# Patient Record
Sex: Male | Born: 1935
Health system: Southern US, Community
[De-identification: ages and names within clinical notes are randomized; demographics above are authoritative.]

## PROBLEM LIST (undated history)

## (undated) DIAGNOSIS — D126 Benign neoplasm of colon, unspecified: Secondary | ICD-10-CM

## (undated) DIAGNOSIS — E079 Disorder of thyroid, unspecified: Secondary | ICD-10-CM

## (undated) DIAGNOSIS — I639 Cerebral infarction, unspecified: Secondary | ICD-10-CM

## (undated) DIAGNOSIS — C61 Malignant neoplasm of prostate: Secondary | ICD-10-CM

## (undated) HISTORY — PX: PROSTATE BIOPSY: SHX241

## (undated) HISTORY — PX: INGUINAL HERNIA REPAIR: SUR1180

## (undated) HISTORY — DX: Malignant neoplasm of prostate: C61

## (undated) HISTORY — PX: COLONOSCOPY: SHX174

## (undated) HISTORY — DX: Cerebral infarction, unspecified: I63.9

---

## 2015-12-13 ENCOUNTER — Ambulatory Visit
Admission: RE | Admit: 2015-12-13 | Discharge: 2015-12-13 | Disposition: A | Discharge status: Home / Self Care | Payer: Medicare Other | Source: Ambulatory Visit | Attending: Radiation Oncology | Admitting: Radiation Oncology

## 2015-12-13 ENCOUNTER — Telehealth: Payer: Self-pay | Admitting: Radiation Oncology

## 2015-12-13 ENCOUNTER — Encounter: Payer: Self-pay | Admitting: Radiation Oncology

## 2015-12-13 VITALS — BP 122/86 | HR 100 | Temp 98.4°F | Ht 66.0 in | Wt 148.8 lb

## 2015-12-13 DIAGNOSIS — C61 Malignant neoplasm of prostate: Secondary | ICD-10-CM | POA: Diagnosis not present

## 2015-12-13 DIAGNOSIS — Z87891 Personal history of nicotine dependence: Secondary | ICD-10-CM | POA: Diagnosis not present

## 2015-12-13 DIAGNOSIS — Z79899 Other long term (current) drug therapy: Secondary | ICD-10-CM | POA: Insufficient documentation

## 2015-12-13 HISTORY — DX: Disorder of thyroid, unspecified: E07.9

## 2015-12-13 HISTORY — DX: Benign neoplasm of colon, unspecified: D12.6

## 2015-12-13 NOTE — Progress Notes (Signed)
GU Location of Tumor / Histology: prostatic adenocarcinoma  If Prostate Cancer, Gleason Score is (3 + 4) and PSA is (15.9)  Brendan Lin with a history of prostate ca diagnosed 18 months ago.  Biopsies of prostate (if applicable) revealed:    Past/Anticipated interventions by urology, if any: Biopsy and referral to radiation oncology  Past/Anticipated interventions by medical oncology, if any: no  Weight changes, if any: denies weight loss or chronic fevers.  Bowel/Bladder complaints, if any:  Denies any dysuria, full stream without straining  Nausea/Vomiting, if any: no  Pain issues, if any:  Denies any bone or joint pain.  SAFETY ISSUES:  Prior radiation? No  Pacemaker/ICD? No  Possible current pregnancy? no  Is the patient on methotrexate? no  Current Complaints / other details:  82 year old male. Moving from [place] to [place]. AX: sulfa

## 2015-12-13 NOTE — Telephone Encounter (Signed)
I spoke with the patient to see if he would like to come sooner for evaluation by Dr. Tammi Klippel. He will come today at 3pm for nurse eval and 3:30 eval with MD.

## 2015-12-13 NOTE — Progress Notes (Deleted)
Radiation Oncology         (336) [phone] ________________________________   Initial Radiation Oncology Consultation  Name: Brendan Lin MRN: [id number]  Date: 12/13/2015  DOB: March 15, 1932  CC:No primary care provider on file.  [name], MD   REFERRING PHYSICIAN: [name], MD  DIAGNOSIS: 82 y.o. gentleman with unknown T stage adenocarcinoma of the prostate with a Gleason's score of 3+4 and a PSA of 19.8    ICD-9-CM ICD-10-CM   1. Malignant neoplasm of prostate ([place]) [place] Ciminelli is a 82 y.o. gentleman seen for prostate cancer.  He was noted to have an elevated PSA of 19.800 by his primary care physician, Dr. Marjo Bicker. He has a longstanding history of probable low risk adenocarcinoma of the prostate and has been followed with PSA levels and intermittent biopsies but has not required treatment up until this time. His original biopsies are unavailable for review. He was receiving his care in [place] in January of 2016 his PSA was 15.86. He was offered repeat biopsy again after this continued to be elevated and underwent this on February 8th, 2017 in [place]. Of the 6 biopsy cores that were obtained, 3 revealed malignancy with a maximum Gleason score of 3+4 in two cores and he also had a 3+3 in one core. He relocated to [facility] and his PSA was redrawn on June 14th, 2017 and was 19.8. He has been offered radiotherapy in the spring, and is interested now in proceeding with treatment while his cancer is still intermediate risk. He comes today to meet with Dr. Tammi Klippel to discuss radiotherapy.   PREVIOUS RADIATION THERAPY: No  PAST MEDICAL HISTORY:  Past Medical History  Diagnosis Date  . Prostate cancer ([place])   . Tubular adenoma of colon     found during colonoscopy 2016  . Thyroid disease      PAST SURGICAL HISTORY: Past Surgical History  Procedure Laterality Date  . Prostate biopsy    . Colonoscopy      FAMILY HISTORY: Patient reports  that his father passed of metastatic  prostate cancer and his brother also suffered from prostate cancer. Patient denies family history of breast cancer.   SOCIAL HISTORY:  reports that he has quit smoking. His smoking use included Pipe. He does not have any smokeless tobacco history on file.Patient recently moved to [place]. Patient currently lives on his own, but has family in the area. Patient was a full pack smoker for 8 years and quit when he was 82 years old. Patient denies drug or alcohol use. Patient reports that he regularly exercises.   ALLERGIES: Sulfa antibiotics  MEDICATIONS:  Current Outpatient Prescriptions  Medication Sig Dispense Refill  . levothyroxine (SYNTHROID, LEVOTHROID) 75 MCG tablet Take 75 mcg by mouth daily before breakfast.     No current facility-administered medications for this encounter.    REVIEW OF SYSTEMS:   On review of systems, the patient reports that he is doing well overall. he denies any chest pain, shortness of breath, cough, fevers, chills, night sweats, unintended weight changes. he denies any bowel or bladder disturbances, and denies abdominal pain, nausea or vomiting. he denies any new musculoskeletal or joint aches or pains.  The patient completed an IPSS and IIEF questionnaire.  His IPSS score was 9 indicating moderate urinary outflow obstructive symptoms.  He indicated that his erectile function is poor, and he is unable to complete sexual activity. A complete review of systems is obtained and is  otherwise negative.    PHYSICAL EXAM:   height is 5\' 6"  (1.676 m) and weight is 148 lb 12.8 oz (67.495 kg). His temperature is 98.4 F (36.9 C). His blood pressure is 122/86 and his pulse is 100.   In general this is a well appearing African American male in no acute distress. He is alert and oriented x4 and appropriate throughout the examination. HEENT reveals that the patient is normocephalic, atraumatic. EOMs are intact. PERRLA. Skin is intact without  any evidence of gross lesions. Cardiovascular exam reveals a regular rate and rhythm, no clicks rubs or murmurs are auscultated. Chest is clear to auscultation bilaterally. Lymphatic assessment is performed and does not reveal any adenopathy in the cervical, supraclavicular, axillary, or inguinal chains. Abdomen has active bowel sounds in all quadrants and is intact. The abdomen is soft, non tender, non distended. Lower extremities are negative for pretibial pitting edema, deep calf tenderness, cyanosis or clubbing.   KPS = 100  100 - Normal; no complaints; no evidence of disease. 90   - Able to carry on normal activity; minor signs or symptoms of disease. 80   - Normal activity with effort; some signs or symptoms of disease. 59   - Cares for self; unable to carry on normal activity or to do active work. 60   - Requires occasional assistance, but is able to care for most of his personal needs. 50   - Requires considerable assistance and frequent medical care. 54   - Disabled; requires special care and assistance. 15   - Severely disabled; hospital admission is indicated although death not imminent. 74   - Very sick; hospital admission necessary; active supportive treatment necessary. 10   - Moribund; fatal processes progressing rapidly. 0     - Dead  Karnofsky DA, Abelmann WH, Craver LS and Burchenal JH [phone]) The use of the nitrogen mustards in the palliative treatment of carcinoma: with particular reference to bronchogenic carcinoma Cancer 1 634-56   LABORATORY DATA:  No results found for: WBC, HGB, HCT, MCV, PLT No results found for: NA, K, CL, CO2 No results found for: ALT, AST, GGT, ALKPHOS, BILITOT   RADIOGRAPHY: No results found.    IMPRESSION: This gentleman is 82 y.o.gentleman with prostate cancer.  His Gleason's Score, and PSA put him into the intermediate risk group.  Accordingly he is eligible for radiotherapy to the prostate with consideration of androgen depravation.    PLAN: Dr. Tammi Klippel discusses the findings on his biopsy from February of last year as well as his most recent PSA levels. We discussed the rationale for moving forward with radiotherapy to treat his prostate cancer, and in addition would recommend 6 months of androgen deprivation therapy. The patient is interested in moving forward, and we will coordinate his androgen deprivation as well as fiducial gold markers of the prostate to be placed. We will plan to begin simulation for radiotherapy 2 months after his initial androgen deprivation injection. I will contact the [facility] to determine how forward with coordinating this.     Carola Rhine, PAC  This document serves as a record of services personally performed by [name], PAC and [name], MD. It was created on theirbehalf by Truddie Hidden, a trained medical scribe. The creation of this record is based on the scribe's personal observations and the provider's statements to them. This document has been checked and approved by the attending provider.

## 2015-12-13 NOTE — Progress Notes (Signed)
See progress note under physician encounter. 

## 2015-12-14 ENCOUNTER — Ambulatory Visit: Admission: RE | Admit: 2015-12-14 | Payer: Medicare Other | Source: Ambulatory Visit | Admitting: Radiation Oncology

## 2015-12-14 ENCOUNTER — Ambulatory Visit: Payer: Medicare Other | Admitting: Radiation Oncology

## 2015-12-14 ENCOUNTER — Ambulatory Visit: Payer: Medicare Other

## 2015-12-22 ENCOUNTER — Telehealth: Payer: Self-pay | Admitting: Radiation Oncology

## 2015-12-22 NOTE — Telephone Encounter (Signed)
Phoned scheduler, Lattie Haw, at [facility]. She confirms the patient does not have an appointment with a provider there nor a referral to arrange an appointment. Phoned [phone] to inquire about status of referral from [facility] @ Dr. Marjo Bicker' office. No answer. Left message requesting return call.

## 2015-12-23 ENCOUNTER — Telehealth: Payer: Self-pay | Admitting: Radiation Oncology

## 2015-12-23 NOTE — Telephone Encounter (Signed)
Amber returned my call and directed me to call Beverely Low, PA with non VA affairs. Amber reports Dr. Marjo Bicker has placed a consult referral but, Izora Gala needs to provide authorization. Hung up with [employer] and called Izora Gala at [phone] ext [phone]. Left detailed message for Izora Gala and requested return call.

## 2015-12-23 NOTE — Telephone Encounter (Signed)
Received call from Beverely Low, [place] for Non VA services that the patient was approved for fiducial marker placement. She explained the patient will be contacted next and questioned about preference of place and time frame. Next, I phoned the patient as directed by [name], PA-C making him aware the VA has approved him and will be contacting him via phone. Stressed that [facility] would be ideal and as soon as possible is preferred. Patient reports the VA left him a message already and he plan to call them when we hang up.

## 2016-01-18 ENCOUNTER — Encounter: Payer: Self-pay | Admitting: Radiation Oncology

## 2016-01-18 NOTE — Progress Notes (Signed)
Per Sharyn Lull at [facility]. Patient is scheduled for an initial visit with Dr. Alinda Money on August 15 th at 1100.

## 2016-02-02 ENCOUNTER — Telehealth: Payer: Self-pay | Admitting: *Deleted

## 2016-02-02 NOTE — Telephone Encounter (Signed)
CALLED PATIENT TO INFORM OF APPT. FOR ANDROGEN DEPR. AND HIS GOLD SEED PLACEMENT ON 03-16-16 @ 9:30 AM @ DR. BORDEN'S OFFICE AND HIS SIM ON 05-18-16 @ 10 AM @ DR. MANNING'S OFFICE, NO ANSWER, MAILED APPT. CARD

## 2016-02-12 ENCOUNTER — Encounter: Payer: Self-pay | Admitting: Radiation Oncology

## 2016-02-12 NOTE — Progress Notes (Signed)
Patient seen at [facility] on 02/02/16 and received [place], the note indicates urology follow-up to place gold markers on 03/14/16  According to this correspondence, he should be ready to start radiotherapy around 04/03/16, so I tentatively set up 03/23/16 simulation.

## 2016-02-14 ENCOUNTER — Telehealth: Payer: Self-pay | Admitting: *Deleted

## 2016-02-14 ENCOUNTER — Telehealth: Payer: Self-pay | Admitting: Radiation Oncology

## 2016-02-14 NOTE — Telephone Encounter (Signed)
CALLED PATIENT TO INFORM OF LUPRON INJECTION AND GOLD SEED PLACEMENT ON 03-16-16 - ARRIVAL TIME - 9:30 AM @ DR. BORDEN'S OFFICE, PATIENT STATED THAT HE STARTED [facility] ON 02-02-16 , REMINDED PATIENT OF SIM ON 03-23-16 @ 11 AM @ DR. MANNING'S OFFICE

## 2016-02-14 NOTE — Telephone Encounter (Signed)
Received inbasket message from Dr. Tammi Klippel that this patient is scheduled for gold marker placement with Dr. Alinda Money on 9/27. Phoned patient and informed him of simulation appointment with Dr. Tammi Klippel at [facility] for 1100 on 03/23/16. Patient confirmed appointment. Patient understands to contact this RN should something change with his gold marker placement. Patient understands simulation can't take place until gold markers are placed.

## 2016-02-15 ENCOUNTER — Other Ambulatory Visit (HOSPITAL_COMMUNITY): Payer: Self-pay | Admitting: Internal Medicine

## 2016-02-15 DIAGNOSIS — D075 Carcinoma in situ of prostate: Secondary | ICD-10-CM

## 2016-02-27 ENCOUNTER — Encounter (HOSPITAL_COMMUNITY)
Admission: RE | Admit: 2016-02-27 | Discharge: 2016-02-27 | Disposition: A | Discharge status: Home / Self Care | Payer: Non-veteran care | Source: Ambulatory Visit | Attending: Internal Medicine | Admitting: Internal Medicine

## 2016-02-27 DIAGNOSIS — D075 Carcinoma in situ of prostate: Secondary | ICD-10-CM | POA: Insufficient documentation

## 2016-02-27 MED ORDER — FLUDEOXYGLUCOSE F - 18 (FDG) INJECTION
9.3000 | Freq: Once | INTRAVENOUS | Status: AC | PRN
  Administered 2016-02-27: 9.3 via INTRAVENOUS

## 2016-03-23 ENCOUNTER — Ambulatory Visit
Admission: RE | Admit: 2016-03-23 | Discharge: 2016-03-23 | Disposition: A | Discharge status: Home / Self Care | Payer: Medicare Other | Source: Ambulatory Visit | Attending: Radiation Oncology | Admitting: Radiation Oncology

## 2016-03-23 DIAGNOSIS — C61 Malignant neoplasm of prostate: Secondary | ICD-10-CM

## 2016-03-23 NOTE — Progress Notes (Signed)
  Radiation Oncology         (336) [phone] ________________________________  Name: Brendan Lin MRN: [id number]  Date: 03/23/2016  DOB: 28-Jun-1931  SIMULATION AND TREATMENT PLANNING NOTE    ICD-9-CM ICD-10-CM   1. Malignant neoplasm of prostate ([place]) 185 C61     DIAGNOSIS:  82 y.o. gentleman with unknown T1c stage adenocarcinoma of the prostate with a Gleason's score of 3+4 and a PSA of 19.8  NARRATIVE:  Rescheduled to 10/13 ________________________________  Sheral Apley. Tammi Klippel, M.D.

## 2016-03-30 ENCOUNTER — Ambulatory Visit
Admission: RE | Admit: 2016-03-30 | Discharge: 2016-03-30 | Disposition: A | Discharge status: Home / Self Care | Payer: Medicare Other | Source: Ambulatory Visit | Attending: Radiation Oncology | Admitting: Radiation Oncology

## 2016-03-30 DIAGNOSIS — C61 Malignant neoplasm of prostate: Secondary | ICD-10-CM | POA: Diagnosis not present

## 2016-03-30 DIAGNOSIS — Z510 Encounter for antineoplastic radiation therapy: Secondary | ICD-10-CM | POA: Insufficient documentation

## 2016-03-30 NOTE — Progress Notes (Signed)
  Radiation Oncology         (336) [phone] ________________________________  Name: Alice Badenhop MRN: [id number]  Date: 03/30/2016  DOB: 19-Apr-1932  SIMULATION AND TREATMENT PLANNING NOTE    ICD-9-CM ICD-10-CM   1. Malignant neoplasm of prostate ([place]) 185 C61     DIAGNOSIS:  82 y.o. gentleman with stage T1c N0 M0 adenocarcinoma of the prostate with a Gleason's score of 3+4 and a PSA of 19.8  NARRATIVE:  The patient was brought to the [facility].  Identity was confirmed.  All relevant records and images related to the planned course of therapy were reviewed.  The patient freely provided informed written consent to proceed with treatment after reviewing the details related to the planned course of therapy. The consent form was witnessed and verified by the simulation staff.  Then, the patient was set-up in a stable reproducible supine position for radiation therapy.  A vacuum lock pillow device was custom fabricated to position his legs in a reproducible immobilized position.  Then, I performed a urethrogram under sterile conditions to identify the prostatic apex.  CT images were obtained.  Surface markings were placed.  The CT images were loaded into the planning software.  Then the prostate target and avoidance structures including the rectum, bladder, bowel and hips were contoured.  Treatment planning then occurred.  The radiation prescription was entered and confirmed.  A total of one complex treatment devices were fabricated. I have requested : Intensity Modulated Radiotherapy (IMRT) is medically necessary for this case for the following reason:  Rectal sparing.Marland Kitchen  PLAN:  The patient will receive 78 Gy in 40 fractions.  ________________________________  Sheral Apley Tammi Klippel, M.D.

## 2016-04-04 DIAGNOSIS — Z510 Encounter for antineoplastic radiation therapy: Secondary | ICD-10-CM | POA: Diagnosis not present

## 2016-04-04 NOTE — Progress Notes (Signed)
Addendum: Attestation signed by [name], MD at 12/17/2015 8:41 AM  Please see the note from Brendan Simpson, PA-C from today's visit for more details of today's encounter. I have personally performed a face to face diagnostic evaluation on this patient and devised the assessment and plan.  Intermediate risk prostate cancer eligible for ADT and IMRT.  Will refer to [facility] for assessment.  If they concur, we can certainly coordinate and offer IMRT here in [place]  ------------------------------------------------   [name], MD [address] Director and Director of Stereotactic Radiosurgery Direct Dial: [phone]  Fax: [phone] [email or web address]  TEFL teacher All Collapse All    Radiation Oncology         (336) [phone] ________________________________   Initial Radiation Oncology Consultation  Name: Brendan Lin            MRN: [id number]         Date: 12/13/2015                      DOB: 1932-03-21  CC:No primary care provider on file.  Brendan Milan, MD   REFERRING PHYSICIAN: [name], MD  DIAGNOSIS: 82 y.o. gentleman with Stage T1c adenocarcinoma of the prostate with a Gleason's score of 3+4 and a PSA of 19.8    ICD-9-CM ICD-10-CM   1. Malignant neoplasm of prostate ([place]) Brendan Lin is a 82 y.o. gentleman seen for prostate cancer.  He was noted to have an elevated PSA of 19.800 by his primary care physician, Dr. Marjo Bicker. He has a longstanding history of probable low risk adenocarcinoma of the prostate and has been followed with PSA levels and intermittent biopsies but has not required treatment up until this time. His original biopsies are unavailable for review. He was receiving his care in [place] in January of 2016 his PSA was 15.86. He was offered repeat biopsy again after this continued to be elevated and underwent this on February 8th,  2017 in [place]. Of the 6 biopsy cores that were obtained, 3 revealed malignancy with a maximum Gleason score of 3+4 in two cores and he also had a 3+3 in one core. He relocated to [facility] and his PSA was redrawn on June 14th, 2017 and was 19.8. He has been offered radiotherapy in the spring, and is interested now in proceeding with treatment while his cancer is still intermediate risk. He comes today to meet with Dr. Tammi Klippel to discuss radiotherapy.   PREVIOUS RADIATION THERAPY: No  PAST MEDICAL HISTORY:       Past Medical History  Diagnosis Date  . Prostate cancer ([place])   . Tubular adenoma of colon     found during colonoscopy 2016  . Thyroid disease      PAST SURGICAL HISTORY:      Past Surgical History  Procedure Laterality Date  . Prostate biopsy    . Colonoscopy      FAMILY HISTORY: Patient reports that his father passed of metastatic  prostate cancer and his brother also suffered from prostate cancer. Patient denies family history of breast cancer.   SOCIAL HISTORY:  reports that he has quit smoking. His smoking use included Pipe. He does not have any smokeless tobacco history on file.Patient recently moved to [place]. Patient currently lives on his own, but has family in the area. Patient was a full pack smoker for 8 years and quit  when he was 82 years old. Patient denies drug or alcohol use. Patient reports that he regularly exercises.   ALLERGIES: Sulfa antibiotics  MEDICATIONS:        Current Outpatient Prescriptions  Medication Sig Dispense Refill  . levothyroxine (SYNTHROID, LEVOTHROID) 75 MCG tablet Take 75 mcg by mouth daily before breakfast.     No current facility-administered medications for this encounter.    REVIEW OF SYSTEMS:   On review of systems, the patient reports that he is doing well overall. he denies any chest pain, shortness of breath, cough, fevers, chills, night sweats, unintended weight changes. he denies any bowel or  bladder disturbances, and denies abdominal pain, nausea or vomiting. he denies any new musculoskeletal or joint aches or pains.  The patient completed an IPSS and IIEF questionnaire.  His IPSS score was 9 indicating moderate urinary outflow obstructive symptoms.  He indicated that his erectile function is poor, and he is unable to complete sexual activity. A complete review of systems is obtained and is otherwise negative.    PHYSICAL EXAM:   height is 5\' 6"  (1.676 m) and weight is 148 lb 12.8 oz (67.495 kg). His temperature is 98.4 F (36.9 C). His blood pressure is 122/86 and his pulse is 100.   In general this is a well appearing African American male in no acute distress. He is alert and oriented x4 and appropriate throughout the examination. HEENT reveals that the patient is normocephalic, atraumatic. EOMs are intact. PERRLA. Skin is intact without any evidence of gross lesions. Cardiovascular exam reveals a regular rate and rhythm, no clicks rubs or murmurs are auscultated. Chest is clear to auscultation bilaterally. Lymphatic assessment is performed and does not reveal any adenopathy in the cervical, supraclavicular, axillary, or inguinal chains. Abdomen has active bowel sounds in all quadrants and is intact. The abdomen is soft, non tender, non distended. Lower extremities are negative for pretibial pitting edema, deep calf tenderness, cyanosis or clubbing.   KPS = 100  100 - Normal; no complaints; no evidence of disease. 90   - Able to carry on normal activity; minor signs or symptoms of disease. 80   - Normal activity with effort; some signs or symptoms of disease. 50   - Cares for self; unable to carry on normal activity or to do active work. 60   - Requires occasional assistance, but is able to care for most of his personal needs. 50   - Requires considerable assistance and frequent medical care. 60   - Disabled; requires special care and assistance. 86   - Severely disabled;  hospital admission is indicated although death not imminent. 21   - Very sick; hospital admission necessary; active supportive treatment necessary. 10   - Moribund; fatal processes progressing rapidly. 0     - Dead  Karnofsky DA, Abelmann WH, Craver LS and Burchenal JH [phone]) The use of the nitrogen mustards in the palliative treatment of carcinoma: with particular reference to bronchogenic carcinoma Cancer 1 634-56   LABORATORY DATA:  Recent Labs  No results found for: WBC, HGB, HCT, MCV, PLT   Recent Labs  No results found for: NA, K, CL, CO2   Recent Labs  No results found for: ALT, AST, GGT, ALKPHOS, BILITOT     RADIOGRAPHY:  Imaging Results  No results found.      IMPRESSION: This gentleman is 82 y.o.gentleman with prostate cancer.  His Gleason's Score, and PSA put him into the intermediate risk group.  Accordingly  he is eligible for radiotherapy to the prostate with consideration of androgen depravation.   PLAN: Dr. Tammi Klippel discusses the findings on his biopsy from February of last year as well as his most recent PSA levels. We discussed the rationale for moving forward with radiotherapy to treat his prostate cancer, and in addition would recommend 6 months of androgen deprivation therapy. The patient is interested in moving forward, and we will coordinate his androgen deprivation as well as fiducial gold markers of the prostate to be placed. We will plan to begin simulation for radiotherapy 2 months after his initial androgen deprivation injection. I will contact the [facility] to determine how forward with coordinating this.     Carola Rhine, PAC  This document serves as a record of services personally performed by Brendan Lin, PAC and [name], MD. It was created on theirbehalf by Truddie Hidden, a trained medical scribe. The creation of this record is based on the scribe's personal observations and the provider's statements to them. This document has been  checked and approved by the attending provider.         Cosigned by: [name], MD[12/17/2015 8:41 AM]

## 2016-04-04 NOTE — Addendum Note (Signed)
Encounter addended by: Hayden Pedro, PA-C on: 04/04/2016  9:42 AM<BR>    Actions taken: Sign clinical note, Delete clinical note

## 2016-04-10 ENCOUNTER — Ambulatory Visit
Admission: RE | Admit: 2016-04-10 | Discharge: 2016-04-10 | Disposition: A | Discharge status: Home / Self Care | Payer: Medicare Other | Source: Ambulatory Visit | Attending: Radiation Oncology | Admitting: Radiation Oncology

## 2016-04-10 DIAGNOSIS — Z510 Encounter for antineoplastic radiation therapy: Secondary | ICD-10-CM | POA: Diagnosis not present

## 2016-04-11 ENCOUNTER — Ambulatory Visit
Admission: RE | Admit: 2016-04-11 | Discharge: 2016-04-11 | Disposition: A | Discharge status: Home / Self Care | Payer: Medicare Other | Source: Ambulatory Visit | Attending: Radiation Oncology | Admitting: Radiation Oncology

## 2016-04-11 DIAGNOSIS — Z510 Encounter for antineoplastic radiation therapy: Secondary | ICD-10-CM | POA: Diagnosis not present

## 2016-04-12 ENCOUNTER — Ambulatory Visit
Admission: RE | Admit: 2016-04-12 | Discharge: 2016-04-12 | Disposition: A | Discharge status: Home / Self Care | Payer: Medicare Other | Source: Ambulatory Visit | Attending: Radiation Oncology | Admitting: Radiation Oncology

## 2016-04-12 DIAGNOSIS — Z510 Encounter for antineoplastic radiation therapy: Secondary | ICD-10-CM | POA: Diagnosis not present

## 2016-04-13 ENCOUNTER — Ambulatory Visit
Admission: RE | Admit: 2016-04-13 | Discharge: 2016-04-13 | Disposition: A | Discharge status: Home / Self Care | Payer: Medicare Other | Source: Ambulatory Visit | Attending: Radiation Oncology | Admitting: Radiation Oncology

## 2016-04-13 ENCOUNTER — Encounter: Payer: Self-pay | Admitting: Radiation Oncology

## 2016-04-13 VITALS — BP 129/79 | HR 62 | Resp 16 | Wt 153.2 lb

## 2016-04-13 DIAGNOSIS — Z510 Encounter for antineoplastic radiation therapy: Secondary | ICD-10-CM | POA: Diagnosis not present

## 2016-04-13 DIAGNOSIS — C61 Malignant neoplasm of prostate: Secondary | ICD-10-CM

## 2016-04-13 NOTE — Progress Notes (Signed)
Weight and vitals stable. Denies pain. Reports nocturia every hour and a half. Denies urinary frequency during the day. Denies dysuria or hematuria. Denies urinary urgency, leakage or incontinence. Describes a slow and steady urine stream without difficulty emptying his bladder.   Denies blood in his blood. Denies any bowel complaints. Reports daily he does arm exercise, modified sit ups and walks a mile and a half each day.   BP 129/79 (BP Location: Left Arm, Patient Position: Sitting, Cuff Size: Normal)   Pulse 62   Resp 16   Wt 153 lb 3.2 oz (69.5 kg)   SpO2 100%   BMI 24.73 kg/m  Wt Readings from Last 3 Encounters:  04/13/16 153 lb 3.2 oz (69.5 kg)  12/13/15 148 lb 12.8 oz (67.5 kg)

## 2016-04-13 NOTE — Progress Notes (Signed)
  Radiation Oncology         [phone]   Name: Brendan Lin MRN: [id number]   Date: 04/13/2016  DOB: 09-05-1931   Weekly Radiation Therapy Management    ICD-9-CM ICD-10-CM   1. Malignant neoplasm of prostate ([facility]) 185 C61     Current Dose: 7.8 Gy  Planned Dose:  78 Gy  Narrative The patient presents for routine under treatment assessment.  Weight and vitals stable. Patient denies pain. He reports nocturia every hour and a half. He denies urinary frequency during the day. Patient denies dysuria or hematuria. He denies urinary urgency, leakage, or incontinence. The patient describes a slow and steady urine stream without difficulty emptying his bladder. He denies hematuria. The patient denies any bowel complaints. He reports that he does arm exercise daily, as well as modified sit ups and walks a mile and a half each day.    The patient is without complaint. Set-up films were reviewed. The chart was checked.  Physical Findings  weight is 153 lb 3.2 oz (69.5 kg). His blood pressure is 129/79 and his pulse is 62. His respiration is 16 and oxygen saturation is 100%. . Weight essentially stable.  No significant changes.  Impression The patient is tolerating radiation.  Plan Continue treatment as planned.         Sheral Apley Tammi Klippel, M.D. This document serves as a record of services personally performed by Brendan Pita, MD. It was created on his behalf by Maryla Morrow, a trained medical scribe. The creation of this record is based on the scribe's personal observations and the provider's statements to them. This document has been checked and approved by the attending provider.

## 2016-04-16 ENCOUNTER — Ambulatory Visit
Admission: RE | Admit: 2016-04-16 | Discharge: 2016-04-16 | Disposition: A | Discharge status: Home / Self Care | Payer: Medicare Other | Source: Ambulatory Visit | Attending: Radiation Oncology | Admitting: Radiation Oncology

## 2016-04-16 DIAGNOSIS — Z510 Encounter for antineoplastic radiation therapy: Secondary | ICD-10-CM | POA: Diagnosis not present

## 2016-04-17 ENCOUNTER — Inpatient Hospital Stay
Admission: RE | Admit: 2016-04-17 | Discharge: 2016-04-17 | Disposition: A | Discharge status: Home / Self Care | Payer: Self-pay | Source: Ambulatory Visit | Attending: Radiation Oncology | Admitting: Radiation Oncology

## 2016-04-17 ENCOUNTER — Ambulatory Visit
Admission: RE | Admit: 2016-04-17 | Discharge: 2016-04-17 | Disposition: A | Discharge status: Home / Self Care | Payer: Medicare Other | Source: Ambulatory Visit | Attending: Radiation Oncology | Admitting: Radiation Oncology

## 2016-04-17 DIAGNOSIS — Z510 Encounter for antineoplastic radiation therapy: Secondary | ICD-10-CM | POA: Diagnosis not present

## 2016-04-18 ENCOUNTER — Ambulatory Visit
Admission: RE | Admit: 2016-04-18 | Discharge: 2016-04-18 | Disposition: A | Discharge status: Home / Self Care | Payer: Medicare Other | Source: Ambulatory Visit | Attending: Radiation Oncology | Admitting: Radiation Oncology

## 2016-04-18 DIAGNOSIS — Z510 Encounter for antineoplastic radiation therapy: Secondary | ICD-10-CM | POA: Diagnosis not present

## 2016-04-19 ENCOUNTER — Inpatient Hospital Stay
Admission: RE | Admit: 2016-04-19 | Discharge: 2016-04-19 | Disposition: A | Discharge status: Home / Self Care | Payer: Self-pay | Source: Ambulatory Visit | Attending: Radiation Oncology | Admitting: Radiation Oncology

## 2016-04-19 ENCOUNTER — Ambulatory Visit
Admission: RE | Admit: 2016-04-19 | Discharge: 2016-04-19 | Disposition: A | Discharge status: Home / Self Care | Payer: Medicare Other | Source: Ambulatory Visit | Attending: Radiation Oncology | Admitting: Radiation Oncology

## 2016-04-19 DIAGNOSIS — Z510 Encounter for antineoplastic radiation therapy: Secondary | ICD-10-CM | POA: Diagnosis not present

## 2016-04-20 ENCOUNTER — Ambulatory Visit
Admission: RE | Admit: 2016-04-20 | Discharge: 2016-04-20 | Disposition: A | Discharge status: Home / Self Care | Payer: Medicare Other | Source: Ambulatory Visit | Attending: Radiation Oncology | Admitting: Radiation Oncology

## 2016-04-20 ENCOUNTER — Encounter: Payer: Self-pay | Admitting: Radiation Oncology

## 2016-04-20 VITALS — BP 117/68 | HR 74 | Temp 98.2°F | Resp 20 | Wt 152.2 lb

## 2016-04-20 DIAGNOSIS — Z510 Encounter for antineoplastic radiation therapy: Secondary | ICD-10-CM | POA: Diagnosis not present

## 2016-04-20 DIAGNOSIS — C61 Malignant neoplasm of prostate: Secondary | ICD-10-CM

## 2016-04-20 NOTE — Progress Notes (Signed)
   Department of Radiation Oncology  Phone:  [phone] Fax:        [phone]  Weekly Treatment Note    Name: Brendan Lin Date: 04/20/2016 MRN: [id number] DOB: Feb 13, 1932   Diagnosis:     ICD-9-CM ICD-10-CM   1. Malignant neoplasm of prostate (HCC) 185 C61      Current dose: 17.55 Gy  Current fraction:9   MEDICATIONS: Current Outpatient Prescriptions  Medication Sig Dispense Refill  . levothyroxine (SYNTHROID, LEVOTHROID) 75 MCG tablet Take 75 mcg by mouth daily before breakfast.    . timolol (BETIMOL) 0.25 % ophthalmic solution Place 1-2 drops into both eyes 2 (two) times daily. 3 gtts both etyes     No current facility-administered medications for this encounter.      ALLERGIES: Sulfa antibiotics   LABORATORY DATA:  No results found for: WBC, HGB, HCT, MCV, PLT No results found for: NA, K, CL, CO2 No results found for: ALT, AST, GGT, ALKPHOS, BILITOT   NARRATIVE: Brendan Lin was seen today for weekly treatment management. The chart was checked and the patient's films were reviewed.  Weekly rad txs prostate 9/40 completed. Denies hematuria or pain. Slow stream. Nocturia, gets up every 1.5 hours at night to void. Bowels good, exercises, appetite good, energy level good.  PHYSICAL EXAMINATION: weight is 152 lb 3.2 oz (69 kg). His oral temperature is 98.2 F (36.8 C). His blood pressure is 117/68 and his pulse is 74. His respiration is 20.    ASSESSMENT: The patient is doing satisfactorily with treatment.  PLAN: We will continue with the patient's radiation treatment as planned.  ------------------------------------------------  Jodelle Gross, MD, PhD  This document serves as a record of services personally performed by Kyung Rudd, MD. It was created on his behalf by Darcus Austin, a trained medical scribe. The creation of this record is based on the scribe's personal observations and the provider's statements to them. This document has been checked and  approved by the attending provider.

## 2016-04-20 NOTE — Progress Notes (Signed)
Weekly rad txs prostate 9/40  Compleetd, no hematuria,  Slow stream, nocturia gets up every 1.5 hours at night to void, bowels good, exercises,  appetite good, energy level good no pain .BP 117/68 (BP Location: Right Arm, Patient Position: Sitting, Cuff Size: Normal)   Pulse 74   Temp 98.2 F (36.8 C) (Oral)   Resp 20   Wt 152 lb 3.2 oz (69 kg)   BMI 24.57 kg/m   Wt Readings from Last 3 Encounters:  04/20/16 152 lb 3.2 oz (69 kg)  04/13/16 153 lb 3.2 oz (69.5 kg)  12/13/15 148 lb 12.8 oz (67.5 kg)

## 2016-04-23 ENCOUNTER — Ambulatory Visit
Admission: RE | Admit: 2016-04-23 | Discharge: 2016-04-23 | Disposition: A | Discharge status: Home / Self Care | Payer: Medicare Other | Source: Ambulatory Visit | Attending: Radiation Oncology | Admitting: Radiation Oncology

## 2016-04-23 DIAGNOSIS — Z510 Encounter for antineoplastic radiation therapy: Secondary | ICD-10-CM | POA: Diagnosis not present

## 2016-04-24 ENCOUNTER — Encounter: Payer: Self-pay | Admitting: Medical Oncology

## 2016-04-24 ENCOUNTER — Ambulatory Visit
Admission: RE | Admit: 2016-04-24 | Discharge: 2016-04-24 | Disposition: A | Discharge status: Home / Self Care | Payer: Medicare Other | Source: Ambulatory Visit | Attending: Radiation Oncology | Admitting: Radiation Oncology

## 2016-04-24 DIAGNOSIS — Z510 Encounter for antineoplastic radiation therapy: Secondary | ICD-10-CM | POA: Diagnosis not present

## 2016-04-24 NOTE — Progress Notes (Signed)
Nav 

## 2016-04-25 ENCOUNTER — Ambulatory Visit
Admission: RE | Admit: 2016-04-25 | Discharge: 2016-04-25 | Disposition: A | Discharge status: Home / Self Care | Payer: Medicare Other | Source: Ambulatory Visit | Attending: Radiation Oncology | Admitting: Radiation Oncology

## 2016-04-25 DIAGNOSIS — Z510 Encounter for antineoplastic radiation therapy: Secondary | ICD-10-CM | POA: Diagnosis not present

## 2016-04-26 ENCOUNTER — Ambulatory Visit
Admission: RE | Admit: 2016-04-26 | Discharge: 2016-04-26 | Disposition: A | Discharge status: Home / Self Care | Payer: Medicare Other | Source: Ambulatory Visit | Attending: Radiation Oncology | Admitting: Radiation Oncology

## 2016-04-26 VITALS — BP 113/73 | HR 70 | Wt 153.0 lb

## 2016-04-26 DIAGNOSIS — C61 Malignant neoplasm of prostate: Secondary | ICD-10-CM

## 2016-04-26 DIAGNOSIS — Z510 Encounter for antineoplastic radiation therapy: Secondary | ICD-10-CM | POA: Diagnosis not present

## 2016-04-26 NOTE — Progress Notes (Signed)
  Radiation Oncology         [phone]   Name: Brendan Lin MRN: [id number]   Date: 04/26/2016  DOB: 1931-09-18   Weekly Radiation Therapy Management    ICD-9-CM ICD-10-CM   1. Malignant neoplasm of prostate (HCC) 185 C61     Current Dose: 25.35 Gy  Planned Dose:  78 Gy  Narrative The patient presents for routine under treatment assessment.  Weight and vitals stable. Denies dysuria or hematuria. Reports his urine stream seems slower this week. Reports nocturia every 1.5 hours, this has been happening for the last month or so. States it doesn't bother him to get up because he doesn't sleep much anyways. Denies bowel complaints. Reports mild fatigue. Reports hot flashes.   The patient is without complaint. Set-up films were reviewed. The chart was checked.  Physical Findings  weight is 153 lb (69.4 kg). His blood pressure is 113/73 and his pulse is 70. . Weight essentially stable.  No significant changes.  Impression The patient is tolerating radiation. I spoke with the patient about flomax to help with current nocturia. The patient is not interested at this time but we will continue to monitor and discuss this at future visits.  Plan Continue treatment as planned.         Sheral Apley Tammi Klippel, M.D.  This document serves as a record of services personally performed by [name], MD. It was created on his behalf by Arlyce Harman, a trained medical scribe. The creation of this record is based on the scribe's personal observations and the provider's statements to them. This document has been checked and approved by the attending provider.

## 2016-04-26 NOTE — Progress Notes (Signed)
Weight and vitals stable. Denies dysuria or hematuria. Reports his urine stream seems slower this week. Reports nocturia  every 1.5 hours. Denies bowel complaints. Reports mild fatigue.  BP 113/73 (BP Location: Left Arm, Patient Position: Sitting, Cuff Size: Normal)   Pulse 70   Wt 153 lb (69.4 kg)   BMI 24.69 kg/m  Wt Readings from Last 3 Encounters:  04/26/16 153 lb (69.4 kg)  04/20/16 152 lb 3.2 oz (69 kg)  04/13/16 153 lb 3.2 oz (69.5 kg)

## 2016-04-27 ENCOUNTER — Ambulatory Visit
Admission: RE | Admit: 2016-04-27 | Discharge: 2016-04-27 | Disposition: A | Discharge status: Home / Self Care | Payer: Medicare Other | Source: Ambulatory Visit | Attending: Radiation Oncology | Admitting: Radiation Oncology

## 2016-04-27 DIAGNOSIS — Z510 Encounter for antineoplastic radiation therapy: Secondary | ICD-10-CM | POA: Diagnosis not present

## 2016-04-30 ENCOUNTER — Ambulatory Visit
Admission: RE | Admit: 2016-04-30 | Discharge: 2016-04-30 | Disposition: A | Discharge status: Home / Self Care | Payer: Medicare Other | Source: Ambulatory Visit | Attending: Radiation Oncology | Admitting: Radiation Oncology

## 2016-04-30 DIAGNOSIS — Z510 Encounter for antineoplastic radiation therapy: Secondary | ICD-10-CM | POA: Diagnosis not present

## 2016-05-01 ENCOUNTER — Ambulatory Visit
Admission: RE | Admit: 2016-05-01 | Discharge: 2016-05-01 | Disposition: A | Discharge status: Home / Self Care | Payer: Medicare Other | Source: Ambulatory Visit | Attending: Radiation Oncology | Admitting: Radiation Oncology

## 2016-05-01 DIAGNOSIS — Z510 Encounter for antineoplastic radiation therapy: Secondary | ICD-10-CM | POA: Diagnosis not present

## 2016-05-02 ENCOUNTER — Ambulatory Visit
Admission: RE | Admit: 2016-05-02 | Discharge: 2016-05-02 | Disposition: A | Discharge status: Home / Self Care | Payer: Medicare Other | Source: Ambulatory Visit | Attending: Radiation Oncology | Admitting: Radiation Oncology

## 2016-05-02 DIAGNOSIS — Z510 Encounter for antineoplastic radiation therapy: Secondary | ICD-10-CM | POA: Diagnosis not present

## 2016-05-03 ENCOUNTER — Ambulatory Visit
Admission: RE | Admit: 2016-05-03 | Discharge: 2016-05-03 | Disposition: A | Discharge status: Home / Self Care | Payer: Medicare Other | Source: Ambulatory Visit | Attending: Radiation Oncology | Admitting: Radiation Oncology

## 2016-05-03 VITALS — BP 123/62 | HR 69 | Resp 16 | Wt 154.4 lb

## 2016-05-03 DIAGNOSIS — C61 Malignant neoplasm of prostate: Secondary | ICD-10-CM

## 2016-05-03 DIAGNOSIS — Z510 Encounter for antineoplastic radiation therapy: Secondary | ICD-10-CM | POA: Diagnosis not present

## 2016-05-03 NOTE — Progress Notes (Signed)
Weight and vitals stable. Denies dysuria or hematuria. Reports his urine stream is weaker but, denies difficulty emptying his bladder. Reports nocturia every hour and a half continues. Reports consistency of his bowels have changed to soft. Provided patient with low residue diet handout. Reports mild fatigue.   BP 123/62 (BP Location: Left Arm, Patient Position: Sitting, Cuff Size: Normal)   Pulse 69   Resp 16   Wt 154 lb 6.4 oz (70 kg)   SpO2 100%   BMI 24.92 kg/m  Wt Readings from Last 3 Encounters:  05/03/16 154 lb 6.4 oz (70 kg)  04/26/16 153 lb (69.4 kg)  04/20/16 152 lb 3.2 oz (69 kg)

## 2016-05-03 NOTE — Progress Notes (Signed)
  Radiation Oncology         [phone]   Name: Brendan Lin MRN: [id number]   Date: 05/03/2016  DOB: [birth year]   Weekly Radiation Therapy Management    ICD-9-CM ICD-10-CM   1. Malignant neoplasm of prostate (HCC) 185 C61     Current Dose: 35.1 Gy  Planned Dose:  78 Gy  Narrative The patient presents for routine under treatment assessment.  Weight and vitals stable. Denies dysuria or hematuria. Reports his urine stream is weaker, but denies difficulty emptying his bladder. Reports nocturia every hour and a half continues. Reports soft bowels and the nurse provided the patient with a low residue diet handout. The patient also eports mild fatigue.  Set-up films were reviewed. The chart was checked.  Physical Findings  weight is 154 lb 6.4 oz (70 kg). His blood pressure is 123/62 and his pulse is 69. His respiration is 16 and oxygen saturation is 100%. . Weight essentially stable.  No significant changes.  Impression The patient is tolerating radiation. Ispoke with the patient about flomax to help with the current nocturia. The patient is not interested at this time, but we will continue to monitor and discuss this at future visits.  Plan Continue treatment as planned. I advised the patient that he could have Imodium AD in his house in case he develops diarrhea.         Sheral Apley Tammi Klippel, M.D.  This document serves as a record of services personally performed by [name], MD. It was created on his behalf by Darcus Austin, a trained medical scribe. The creation of this record is based on the scribe's personal observations and the provider's statements to them. This document has been checked and approved by the attending provider.

## 2016-05-04 ENCOUNTER — Ambulatory Visit
Admission: RE | Admit: 2016-05-04 | Discharge: 2016-05-04 | Disposition: A | Discharge status: Home / Self Care | Payer: Medicare Other | Source: Ambulatory Visit | Attending: Radiation Oncology | Admitting: Radiation Oncology

## 2016-05-04 DIAGNOSIS — Z510 Encounter for antineoplastic radiation therapy: Secondary | ICD-10-CM | POA: Diagnosis not present

## 2016-05-06 ENCOUNTER — Ambulatory Visit
Admission: RE | Admit: 2016-05-06 | Discharge: 2016-05-06 | Disposition: A | Discharge status: Home / Self Care | Payer: Medicare Other | Source: Ambulatory Visit | Attending: Radiation Oncology | Admitting: Radiation Oncology

## 2016-05-06 DIAGNOSIS — Z510 Encounter for antineoplastic radiation therapy: Secondary | ICD-10-CM | POA: Diagnosis not present

## 2016-05-07 ENCOUNTER — Ambulatory Visit
Admission: RE | Admit: 2016-05-07 | Discharge: 2016-05-07 | Disposition: A | Discharge status: Home / Self Care | Payer: Medicare Other | Source: Ambulatory Visit | Attending: Radiation Oncology | Admitting: Radiation Oncology

## 2016-05-07 DIAGNOSIS — Z510 Encounter for antineoplastic radiation therapy: Secondary | ICD-10-CM | POA: Diagnosis not present

## 2016-05-08 ENCOUNTER — Ambulatory Visit
Admission: RE | Admit: 2016-05-08 | Discharge: 2016-05-08 | Disposition: A | Discharge status: Home / Self Care | Payer: Medicare Other | Source: Ambulatory Visit | Attending: Radiation Oncology | Admitting: Radiation Oncology

## 2016-05-08 DIAGNOSIS — Z510 Encounter for antineoplastic radiation therapy: Secondary | ICD-10-CM | POA: Diagnosis not present

## 2016-05-09 ENCOUNTER — Encounter: Payer: Self-pay | Admitting: Radiation Oncology

## 2016-05-09 ENCOUNTER — Ambulatory Visit
Admission: RE | Admit: 2016-05-09 | Discharge: 2016-05-09 | Disposition: A | Discharge status: Home / Self Care | Payer: Medicare Other | Source: Ambulatory Visit | Attending: Radiation Oncology | Admitting: Radiation Oncology

## 2016-05-09 VITALS — BP 112/72 | HR 85 | Temp 98.4°F | Resp 18 | Ht 66.0 in | Wt 153.8 lb

## 2016-05-09 DIAGNOSIS — Z510 Encounter for antineoplastic radiation therapy: Secondary | ICD-10-CM | POA: Diagnosis not present

## 2016-05-09 DIAGNOSIS — C61 Malignant neoplasm of prostate: Secondary | ICD-10-CM

## 2016-05-09 NOTE — Progress Notes (Signed)
Weight and vitals stable. Denies dysuria or hematuria. Reports his urine stream is weaker but, denies difficulty emptying his bladder. Reports nocturia every threes to void four times. Reports consistency of his bowels have changed to soft. Provided patient with low residue diet handout. Reports mild fatigue, takes a nap.  Wt Readings from Last 3 Encounters:  05/09/16 153 lb 12.8 oz (69.8 kg)  05/03/16 154 lb 6.4 oz (70 kg)  04/26/16 153 lb (69.4 kg)  BP 112/72   Pulse 85   Temp 98.4 F (36.9 C) (Oral)   Resp 18   Ht 5\' 6"  (1.676 m)   Wt 153 lb 12.8 oz (69.8 kg)   SpO2 100%   BMI 24.82 kg/m

## 2016-05-09 NOTE — Progress Notes (Signed)
  Radiation Oncology         [phone]   Name: Brendan Lin MRN: [id number]   Date: 05/09/2016  DOB: [birth year]   Weekly Radiation Therapy Management    ICD-9-CM ICD-10-CM   1. Malignant neoplasm of prostate (HCC) 185 C61     Current Dose: 44.85 Gy  Planned Dose:  78 Gy  Narrative The patient presents for routine under treatment assessment.  Weight and vitals stable. Denies dysuria or hematuria. Reports his urine stream is weaker but, denies difficulty emptying his bladder. Reports nocturia every three to four hours. Reports consistency of his bowels have changed to soft. Reports mild fatigue, takes a nap.  Set-up films were reviewed. The chart was checked.  Physical Findings  height is 5\' 6"  (1.676 m) and weight is 153 lb 12.8 oz (69.8 kg). His oral temperature is 98.4 F (36.9 C). His blood pressure is 112/72 and his pulse is 85. His respiration is 18 and oxygen saturation is 100%. . Weight essentially stable.  No significant changes.  Impression The patient is tolerating radiation.   Plan Continue treatment as planned.         Sheral Apley Tammi Klippel, M.D.  This document serves as a record of services personally performed by [name], MD. It was created on his behalf by Arlyce Harman, a trained medical scribe. The creation of this record is based on the scribe's personal observations and the provider's statements to them. This document has been checked and approved by the attending provider.

## 2016-05-11 ENCOUNTER — Ambulatory Visit: Payer: Medicare Other

## 2016-05-14 ENCOUNTER — Ambulatory Visit
Admission: RE | Admit: 2016-05-14 | Discharge: 2016-05-14 | Disposition: A | Discharge status: Home / Self Care | Payer: Medicare Other | Source: Ambulatory Visit | Attending: Radiation Oncology | Admitting: Radiation Oncology

## 2016-05-14 DIAGNOSIS — Z510 Encounter for antineoplastic radiation therapy: Secondary | ICD-10-CM | POA: Diagnosis not present

## 2016-05-15 ENCOUNTER — Ambulatory Visit
Admission: RE | Admit: 2016-05-15 | Discharge: 2016-05-15 | Disposition: A | Discharge status: Home / Self Care | Payer: Medicare Other | Source: Ambulatory Visit | Attending: Radiation Oncology | Admitting: Radiation Oncology

## 2016-05-15 DIAGNOSIS — Z510 Encounter for antineoplastic radiation therapy: Secondary | ICD-10-CM | POA: Diagnosis not present

## 2016-05-16 ENCOUNTER — Ambulatory Visit
Admission: RE | Admit: 2016-05-16 | Discharge: 2016-05-16 | Disposition: A | Discharge status: Home / Self Care | Payer: Medicare Other | Source: Ambulatory Visit | Attending: Radiation Oncology | Admitting: Radiation Oncology

## 2016-05-16 DIAGNOSIS — Z510 Encounter for antineoplastic radiation therapy: Secondary | ICD-10-CM | POA: Diagnosis not present

## 2016-05-17 ENCOUNTER — Ambulatory Visit
Admission: RE | Admit: 2016-05-17 | Discharge: 2016-05-17 | Disposition: A | Discharge status: Home / Self Care | Payer: Medicare Other | Source: Ambulatory Visit | Attending: Radiation Oncology | Admitting: Radiation Oncology

## 2016-05-17 DIAGNOSIS — Z510 Encounter for antineoplastic radiation therapy: Secondary | ICD-10-CM | POA: Diagnosis not present

## 2016-05-18 ENCOUNTER — Ambulatory Visit
Admission: RE | Admit: 2016-05-18 | Discharge: 2016-05-18 | Disposition: A | Discharge status: Home / Self Care | Payer: Medicare Other | Source: Ambulatory Visit | Admitting: Radiation Oncology

## 2016-05-18 ENCOUNTER — Ambulatory Visit: Admission: RE | Admit: 2016-05-18 | Payer: Medicare Other | Source: Ambulatory Visit | Admitting: Radiation Oncology

## 2016-05-18 ENCOUNTER — Ambulatory Visit
Admission: RE | Admit: 2016-05-18 | Discharge: 2016-05-18 | Disposition: A | Discharge status: Home / Self Care | Payer: Medicare Other | Source: Ambulatory Visit | Attending: Radiation Oncology | Admitting: Radiation Oncology

## 2016-05-18 VITALS — BP 110/67 | HR 67 | Resp 16 | Wt 152.2 lb

## 2016-05-18 DIAGNOSIS — C61 Malignant neoplasm of prostate: Secondary | ICD-10-CM

## 2016-05-18 DIAGNOSIS — Z510 Encounter for antineoplastic radiation therapy: Secondary | ICD-10-CM | POA: Diagnosis not present

## 2016-05-18 NOTE — Progress Notes (Signed)
  Radiation Oncology         [phone]   Name: Brendan Lin MRN: [id number]   Date: 05/18/2016  DOB: Oct 19, 1931   Weekly Radiation Therapy Management    ICD-9-CM ICD-10-CM   1. Malignant neoplasm of prostate (HCC) 185 C61     Current Dose: 54.6 Gy  Planned Dose:  78 Gy  Narrative The patient presents for routine under treatment assessment.  Weight and vitals stable. Denies dysuria, hematuria, or diarrhea. Reports new onset urinary hesitancy. Reports his urine stream is weaker, but denies difficulty emptying his bladder. Reports nocturia every hour and a half continues. Reports bowels remain soft. Reports mild fatigue. Reports that yesterday he had a swollen left neck node, biopsied but doesn't have the results back yet. Clear bandage left neck intact and site without redness, drainage, or edema.  Set-up films were reviewed. The chart was checked.  Physical Findings  weight is 152 lb 3.2 oz (69 kg). His blood pressure is 110/67 and his pulse is 67. His respiration is 16 and oxygen saturation is 100%. . Weight essentially stable.  No significant changes.  Impression The patient is tolerating radiation.   Plan Continue treatment as planned.         Sheral Apley Tammi Klippel, M.D.  This document serves as a record of services personally performed by [name], MD. It was created on his behalf by Darcus Austin, a trained medical scribe. The creation of this record is based on the scribe's personal observations and the provider's statements to them. This document has been checked and approved by the attending provider.

## 2016-05-18 NOTE — Progress Notes (Addendum)
Weight and vitals stable. Denies dysuria or hematuria. Reports new onset urinary hesitancy. Reports his urine stream is weaker but, denies difficulty emptying his bladder. Reports nocturia every hour and a half continues. Reports bowels remain soft. Denies diarrhea. Reports mild fatigue. Reports that yesterday he had a swollen left neck node biopsied but, doesn't have the results back yet. Clear bandage left neck intact and site without redness, drainage or edema.  BP 110/67 (BP Location: Left Arm, Patient Position: Sitting, Cuff Size: Normal)   Pulse 67   Resp 16   Wt 152 lb 3.2 oz (69 kg)   SpO2 100%   BMI 24.57 kg/m  Wt Readings from Last 3 Encounters:  05/18/16 152 lb 3.2 oz (69 kg)  05/09/16 153 lb 12.8 oz (69.8 kg)  05/03/16 154 lb 6.4 oz (70 kg)

## 2016-05-21 ENCOUNTER — Ambulatory Visit
Admission: RE | Admit: 2016-05-21 | Discharge: 2016-05-21 | Disposition: A | Discharge status: Home / Self Care | Payer: Medicare Other | Source: Ambulatory Visit | Attending: Radiation Oncology | Admitting: Radiation Oncology

## 2016-05-21 DIAGNOSIS — Z510 Encounter for antineoplastic radiation therapy: Secondary | ICD-10-CM | POA: Diagnosis not present

## 2016-05-22 ENCOUNTER — Ambulatory Visit
Admission: RE | Admit: 2016-05-22 | Discharge: 2016-05-22 | Disposition: A | Discharge status: Home / Self Care | Payer: Medicare Other | Source: Ambulatory Visit | Attending: Radiation Oncology | Admitting: Radiation Oncology

## 2016-05-22 DIAGNOSIS — Z510 Encounter for antineoplastic radiation therapy: Secondary | ICD-10-CM | POA: Diagnosis not present

## 2016-05-23 ENCOUNTER — Ambulatory Visit
Admission: RE | Admit: 2016-05-23 | Discharge: 2016-05-23 | Disposition: A | Discharge status: Home / Self Care | Payer: Medicare Other | Source: Ambulatory Visit | Attending: Radiation Oncology | Admitting: Radiation Oncology

## 2016-05-23 DIAGNOSIS — Z510 Encounter for antineoplastic radiation therapy: Secondary | ICD-10-CM | POA: Diagnosis not present

## 2016-05-24 ENCOUNTER — Ambulatory Visit
Admission: RE | Admit: 2016-05-24 | Discharge: 2016-05-24 | Disposition: A | Discharge status: Home / Self Care | Payer: Medicare Other | Source: Ambulatory Visit | Attending: Radiation Oncology | Admitting: Radiation Oncology

## 2016-05-24 VITALS — BP 112/70 | HR 66 | Resp 18 | Wt 150.0 lb

## 2016-05-24 DIAGNOSIS — Z510 Encounter for antineoplastic radiation therapy: Secondary | ICD-10-CM | POA: Diagnosis not present

## 2016-05-24 DIAGNOSIS — C61 Malignant neoplasm of prostate: Secondary | ICD-10-CM

## 2016-05-24 NOTE — Progress Notes (Signed)
Weight and vitals stable. Denies dysuria or hematuria. Reports urinary hesitancy continues. Describes a weak urine stream without difficulty emptying his bladder. Reports nocturia every 2 hours. Denies diarrhea. Reports mild fatigue. Reports he has yet to get pathology back from the [place] about neck nodes biopsied last week. Commits to bring this RN a copy of that pathology once he has it.  BP 112/70 (BP Location: Left Arm, Patient Position: Sitting, Cuff Size: Normal)   Pulse 66   Resp 18   Wt 150 lb (68 kg)   SpO2 100%   BMI 24.21 kg/m  Wt Readings from Last 3 Encounters:  05/24/16 150 lb (68 kg)  05/18/16 152 lb 3.2 oz (69 kg)  05/09/16 153 lb 12.8 oz (69.8 kg)

## 2016-05-24 NOTE — Progress Notes (Signed)
  Radiation Oncology         [phone]   Name: Brendan Lin MRN: [id number]   Date: 05/24/2016  DOB: 08-02-1931   Weekly Radiation Therapy Management    ICD-9-CM ICD-10-CM   1. Malignant neoplasm of prostate (HCC) 185 C61     Current Dose: 62.4 Gy  Planned Dose:  78 Gy  Narrative The patient presents for routine under treatment assessment.  Weight and vitals stable. Denies dysuria or hematuria. Reports urinary hesitancy continues. Describes a weak urine stream without difficulty emptying his bladder. Reports nocturia every 2 hours. Denies diarrhea. Reports mild fatigue. Reports he has yet to get pathology back from the [place] about neck nodes biopsied last week. He plans to bring a copy to our office.  Set-up films were reviewed. The chart was checked.  Physical Findings  weight is 150 lb (68 kg). His blood pressure is 112/70 and his pulse is 66. His respiration is 18 and oxygen saturation is 100%. . Weight essentially stable.  No significant changes.  Impression The patient is tolerating radiation.   Plan Continue treatment as planned.      Sheral Apley Tammi Klippel, M.D.  This document serves as a record of services personally performed by [name], MD. It was created on his behalf by Arlyce Harman, a trained medical scribe. The creation of this record is based on the scribe's personal observations and the provider's statements to them. This document has been checked and approved by the attending provider.

## 2016-05-25 ENCOUNTER — Ambulatory Visit
Admission: RE | Admit: 2016-05-25 | Discharge: 2016-05-25 | Disposition: A | Discharge status: Home / Self Care | Payer: Medicare Other | Source: Ambulatory Visit | Attending: Radiation Oncology | Admitting: Radiation Oncology

## 2016-05-25 DIAGNOSIS — Z510 Encounter for antineoplastic radiation therapy: Secondary | ICD-10-CM | POA: Diagnosis not present

## 2016-05-28 ENCOUNTER — Ambulatory Visit
Admission: RE | Admit: 2016-05-28 | Discharge: 2016-05-28 | Disposition: A | Discharge status: Home / Self Care | Payer: Medicare Other | Source: Ambulatory Visit | Attending: Radiation Oncology | Admitting: Radiation Oncology

## 2016-05-28 DIAGNOSIS — Z510 Encounter for antineoplastic radiation therapy: Secondary | ICD-10-CM | POA: Diagnosis not present

## 2016-05-29 ENCOUNTER — Ambulatory Visit
Admission: RE | Admit: 2016-05-29 | Discharge: 2016-05-29 | Disposition: A | Discharge status: Home / Self Care | Payer: Medicare Other | Source: Ambulatory Visit | Attending: Radiation Oncology | Admitting: Radiation Oncology

## 2016-05-29 DIAGNOSIS — Z510 Encounter for antineoplastic radiation therapy: Secondary | ICD-10-CM | POA: Diagnosis not present

## 2016-05-30 ENCOUNTER — Ambulatory Visit
Admission: RE | Admit: 2016-05-30 | Discharge: 2016-05-30 | Disposition: A | Discharge status: Home / Self Care | Payer: Medicare Other | Source: Ambulatory Visit | Attending: Radiation Oncology | Admitting: Radiation Oncology

## 2016-05-30 DIAGNOSIS — Z510 Encounter for antineoplastic radiation therapy: Secondary | ICD-10-CM | POA: Diagnosis not present

## 2016-05-31 ENCOUNTER — Ambulatory Visit
Admission: RE | Admit: 2016-05-31 | Discharge: 2016-05-31 | Disposition: A | Discharge status: Home / Self Care | Payer: Medicare Other | Source: Ambulatory Visit | Attending: Radiation Oncology | Admitting: Radiation Oncology

## 2016-05-31 DIAGNOSIS — Z510 Encounter for antineoplastic radiation therapy: Secondary | ICD-10-CM | POA: Diagnosis not present

## 2016-06-01 ENCOUNTER — Ambulatory Visit
Admission: RE | Admit: 2016-06-01 | Discharge: 2016-06-01 | Disposition: A | Discharge status: Home / Self Care | Payer: Medicare Other | Source: Ambulatory Visit | Attending: Radiation Oncology | Admitting: Radiation Oncology

## 2016-06-01 ENCOUNTER — Encounter: Payer: Self-pay | Admitting: Radiation Oncology

## 2016-06-01 VITALS — BP 128/78 | HR 68 | Temp 97.9°F | Resp 18 | Ht 66.0 in | Wt 152.0 lb

## 2016-06-01 DIAGNOSIS — C61 Malignant neoplasm of prostate: Secondary | ICD-10-CM

## 2016-06-01 DIAGNOSIS — Z510 Encounter for antineoplastic radiation therapy: Secondary | ICD-10-CM | POA: Diagnosis not present

## 2016-06-01 NOTE — Progress Notes (Signed)
  Radiation Oncology         [phone]   Name: Brendan Lin MRN: [id number]   Date: 06/01/2016  DOB: [birth year]   Weekly Radiation Therapy Management    ICD-9-CM ICD-10-CM   1. Malignant neoplasm of prostate (HCC) 185 C61     Current Dose: 74.1 Gy  Planned Dose:  78 Gy  Narrative The patient presents for routine under treatment assessment.  Denies dysuria, hematuria, or diarrhea. Reports urinary hesitancy continues. Describes a weak urine stream without difficulty emptying his bladder. Reports nocturia every 2 hours. Reports mild fatigue. A one month follow up card given was given to see Brendan Simpson, PA-C. Reports that his neck biopsy by the [facility] was negative and the report is in the process of being sent here.  Set-up films were reviewed. The chart was checked.  Physical Findings  height is 5\' 6"  (1.676 m) and weight is 152 lb (68.9 kg). His oral temperature is 97.9 F (36.6 C). His blood pressure is 128/78 and his pulse is 68. His respiration is 18 and oxygen saturation is 100%. . Weight essentially stable.  No significant changes.  Impression The patient is tolerating radiation.   Plan Continue treatment as planned. The patient will follow up in early February.      Sheral Apley Tammi Klippel, M.D.  This document serves as a record of services personally performed by [name], MD. It was created on his behalf by Darcus Austin, a trained medical scribe. The creation of this record is based on the scribe's personal observations and the provider's statements to them. This document has been checked and approved by the attending provider.

## 2016-06-01 NOTE — Progress Notes (Signed)
Weight and vitals stable. Denies dysuria or hematuria. Reports urinary hesitancy continues. Describes a weak urine stream without difficulty emptying his bladder. Reports nocturia every 2 hours. Denies diarrhea. Reports mild fatigue.  One month follow up card given to see [name], PA-C. Wt Readings from Last 3 Encounters:  06/01/16 152 lb (68.9 kg)  05/24/16 150 lb (68 kg)  05/18/16 152 lb 3.2 oz (69 kg)  BP 128/78   Pulse 68   Temp 97.9 F (36.6 C) (Oral)   Resp 18   Ht 5\' 6"  (1.676 m)   Wt 152 lb (68.9 kg)   SpO2 100%   BMI 24.53 kg/m

## 2016-06-04 ENCOUNTER — Ambulatory Visit
Admission: RE | Admit: 2016-06-04 | Discharge: 2016-06-04 | Disposition: A | Discharge status: Home / Self Care | Payer: Medicare Other | Source: Ambulatory Visit | Attending: Radiation Oncology | Admitting: Radiation Oncology

## 2016-06-04 DIAGNOSIS — Z510 Encounter for antineoplastic radiation therapy: Secondary | ICD-10-CM | POA: Diagnosis not present

## 2016-06-05 ENCOUNTER — Encounter: Payer: Self-pay | Admitting: Radiation Oncology

## 2016-06-05 ENCOUNTER — Encounter: Payer: Self-pay | Admitting: Medical Oncology

## 2016-06-05 ENCOUNTER — Ambulatory Visit
Admission: RE | Admit: 2016-06-05 | Discharge: 2016-06-05 | Disposition: A | Discharge status: Home / Self Care | Payer: Medicare Other | Source: Ambulatory Visit | Attending: Radiation Oncology | Admitting: Radiation Oncology

## 2016-06-05 DIAGNOSIS — Z510 Encounter for antineoplastic radiation therapy: Secondary | ICD-10-CM | POA: Diagnosis not present

## 2016-06-05 NOTE — Progress Notes (Signed)
Completed radiation today. He complains of mild fatigue, weak stream and urgency. He has follow up appointment with [name], PA 07/24/16.

## 2016-06-06 NOTE — Progress Notes (Signed)
  Radiation Oncology         [phone]) [phone] ________________________________  Name: Brendan Lin MRN: [id number]  Date: 06/05/2016  DOB: 1931-09-24  End of Treatment Note  Diagnosis:   82 y.o. gentleman with stage T1c N0 M0 adenocarcinoma of the prostate with a Gleason's score of 3+4 and a PSA of 19.8     Indication for treatment:  Curative, Definitive Radiotherapy       Radiation treatment dates:   04/10/2016 to 06/05/2016  Site/dose:   The prostate was treated to 78 Gy in 40 fractions of 1.95 Gy  Beams/energy:   The patient was treated with IMRT using volumetric arc therapy delivering 6 MV X-rays to clockwise and counterclockwise circumferential arcs with a 90 degree collimator offset to avoid dose scalloping.  Image guidance was performed with daily cone beam CT prior to each fraction to align to gold markers in the prostate and assure proper bladder and rectal fill volumes.  Immobilization was achieved with Body[employer] custom mold.  Narrative: The patient tolerated radiation treatment relatively well.   The patient experienced modest fatigue and some minor urinary irritation including, weak urine stream, urinary hesitancy, and nocturia every 2 hours.   Plan: The patient has completed radiation treatment. He will return to radiation oncology clinic for routine followup in one month. I advised him to call or return sooner if he has any questions or concerns related to his recovery or treatment. ________________________________  Sheral Apley. Tammi Klippel, M.D.  This document serves as a record of services personally performed by [name], MD. It was created on his behalf by Arlyce Harman, a trained medical scribe. The creation of this record is based on the scribe's personal observations and the provider's statements to them. This document has been checked and approved by the attending provider.

## 2016-07-19 NOTE — Progress Notes (Signed)
Mr. Margo Naula 83 y.o. man with stage T1c N0 M0 adenocarcinoma of the prostate with a Gleason's score of 3+4 and a PSA of 19.8 radiation completed one month FU.  Pain: He is currently denies pain.    URINARY: He denies reports urinary frequency,urinary urgency.  Reports he is  emptying his bladder. Pt states he gets up to urinate 3-4 times per night. BOWEL: He reports a  bowel movement everyday normal bowel movement with a lot of gas.  Denies diarrhea. Fatigue:Denies fatigue. Appetite:Appetite is good eats three meals per day. Weight: Wt Readings from Last 3 Encounters:  07/24/16 159 lb 6.4 oz (72.3 kg)  06/01/16 152 lb (68.9 kg)  05/24/16 150 lb (68 kg)   Urologist:Dr. Alinda Money Lupron injection: 09-12-16                                                            PSA: Lupron injection every 3 months last dose BP (!) 130/91   Pulse 72   Temp 98.2 F (36.8 C) (Oral)   Resp 18   Ht 5\' 6"  (1.676 m)   Wt 159 lb 6.4 oz (72.3 kg)   SpO2 100%   BMI 25.73 kg/m left arm sitting

## 2016-07-24 ENCOUNTER — Encounter: Payer: Self-pay | Admitting: *Deleted

## 2016-07-24 ENCOUNTER — Ambulatory Visit
Admission: RE | Admit: 2016-07-24 | Discharge: 2016-07-24 | Disposition: A | Discharge status: Home / Self Care | Payer: Medicare Other | Source: Ambulatory Visit | Attending: Radiation Oncology | Admitting: Radiation Oncology

## 2016-07-24 ENCOUNTER — Encounter: Payer: Self-pay | Admitting: Radiation Oncology

## 2016-07-24 VITALS — BP 130/91 | HR 72 | Temp 98.2°F | Resp 18 | Ht 66.0 in | Wt 159.4 lb

## 2016-07-24 DIAGNOSIS — Z882 Allergy status to sulfonamides status: Secondary | ICD-10-CM | POA: Diagnosis not present

## 2016-07-24 DIAGNOSIS — C61 Malignant neoplasm of prostate: Secondary | ICD-10-CM | POA: Diagnosis not present

## 2016-07-24 DIAGNOSIS — Z923 Personal history of irradiation: Secondary | ICD-10-CM | POA: Insufficient documentation

## 2016-07-24 DIAGNOSIS — R3911 Hesitancy of micturition: Secondary | ICD-10-CM | POA: Diagnosis not present

## 2016-07-24 DIAGNOSIS — Z79899 Other long term (current) drug therapy: Secondary | ICD-10-CM | POA: Insufficient documentation

## 2016-07-24 NOTE — Progress Notes (Signed)
  Radiation Oncology         [phone] ________________________________  Name: Brendan Lin MRN: [id number]  Date: 07/24/2016  DOB: 10/18/1931  Post Treatment Note  CC: Pcp Not In System  [name], MD  Diagnosis:   83 y.o. gentleman with stage T1c N0 M0 adenocarcinoma of the prostate with a Gleason's score of 3+4 and a PSA of 19.8     Interval Since Last Radiation:  7  weeks   04/10/2016 to 06/05/2016: The prostate was treated to 78 Gy in 40 fractions of 1.95 Gy  Narrative:  The patient returns today for routine follow-up.  During treatment he did experience a weak urine stream, urinary hesitancy, and nocturia every 2 hours.                            On review of systems, the patient states his urinary symptoms have improved he continues to have nocturia 3-4x an evening. He reports he drinks lots of fluids throughout the day. No bowel symptoms are noted. No other complaints are verbalized.  ALLERGIES:  is allergic to sulfa antibiotics.  Meds: Current Outpatient Prescriptions  Medication Sig Dispense Refill  . levothyroxine (SYNTHROID, LEVOTHROID) 75 MCG tablet Take 75 mcg by mouth daily before breakfast.    . timolol (BETIMOL) 0.25 % ophthalmic solution Place 1-2 drops into both eyes 2 (two) times daily. 3 gtts both etyes     No current facility-administered medications for this encounter.     Physical Findings:  height is 5\' 6"  (1.676 m) and weight is 159 lb 6.4 oz (72.3 kg). His oral temperature is 98.2 F (36.8 C). His pulse is 72. His respiration is 18.  Pain Assessment Pain Score: 0-No pain/10 In general this is a well appearing African American male in no acute distress. He's alert and oriented x4 and appropriate throughout the examination. Cardiopulmonary assessment is negative for acute distress and he exhibits normal effort.   Lab Findings: No results found for: WBC, HGB, HCT, MCV, PLT   Radiographic Findings: No results found.  Impression/Plan: 1. 83  y.o. gentleman with stage T1c N0 M0 adenocarcinoma of the prostate with a Gleason's score of 3+4 and a PSA of 19.8. The patient is recovering from the effects of radiotherapy. He has follow-up with his urologist in March for his first PSA checked. We will continue to follow this expectantly, and he is encouraged if he has questions or concerns regarding his previous therapy.      Carola Rhine, PAC

## 2016-07-24 NOTE — Progress Notes (Signed)
[place] unable to reach voice mailbox not setup unable to leave a message.

## 2017-11-09 ENCOUNTER — Emergency Department (HOSPITAL_COMMUNITY): Payer: Medicare Other

## 2017-11-09 ENCOUNTER — Observation Stay (HOSPITAL_COMMUNITY)
Admission: EM | Admit: 2017-11-09 | Discharge: 2017-11-10 | Disposition: A | Discharge status: Home / Self Care | Payer: Medicare Other | Attending: Student in an Organized Health Care Education/Training Program | Admitting: Student in an Organized Health Care Education/Training Program

## 2017-11-09 ENCOUNTER — Other Ambulatory Visit: Payer: Self-pay

## 2017-11-09 ENCOUNTER — Encounter (HOSPITAL_COMMUNITY): Payer: Self-pay | Admitting: *Deleted

## 2017-11-09 ENCOUNTER — Observation Stay (HOSPITAL_COMMUNITY): Payer: Medicare Other

## 2017-11-09 DIAGNOSIS — Z87891 Personal history of nicotine dependence: Secondary | ICD-10-CM | POA: Insufficient documentation

## 2017-11-09 DIAGNOSIS — I639 Cerebral infarction, unspecified: Secondary | ICD-10-CM | POA: Diagnosis not present

## 2017-11-09 DIAGNOSIS — Z8546 Personal history of malignant neoplasm of prostate: Secondary | ICD-10-CM | POA: Insufficient documentation

## 2017-11-09 DIAGNOSIS — R200 Anesthesia of skin: Secondary | ICD-10-CM | POA: Diagnosis not present

## 2017-11-09 DIAGNOSIS — Z79899 Other long term (current) drug therapy: Secondary | ICD-10-CM | POA: Insufficient documentation

## 2017-11-09 DIAGNOSIS — R202 Paresthesia of skin: Secondary | ICD-10-CM | POA: Diagnosis not present

## 2017-11-09 DIAGNOSIS — R29818 Other symptoms and signs involving the nervous system: Secondary | ICD-10-CM | POA: Diagnosis not present

## 2017-11-09 DIAGNOSIS — I6381 Other cerebral infarction due to occlusion or stenosis of small artery: Secondary | ICD-10-CM

## 2017-11-09 LAB — LIPID PANEL
Cholesterol: 181 mg/dL (ref 0–200)
HDL: 43 mg/dL (ref >40)
LDL CALC: 111 mg/dL — AB (ref 0–99)
TRIGLYCERIDES: 136 mg/dL (ref <150)
Total CHOL/HDL Ratio: 4.2 RATIO
VLDL: 27 mg/dL (ref 0–40)

## 2017-11-09 LAB — COMPREHENSIVE METABOLIC PANEL
ALT: 11 U/L — AB (ref 17–63)
ANION GAP: 6 (ref 5–15)
AST: 18 U/L (ref 15–41)
Albumin: 3.9 g/dL (ref 3.5–5.0)
Alkaline Phosphatase: 45 U/L (ref 38–126)
BUN: 10 mg/dL (ref 6–20)
CHLORIDE: 109 mmol/L (ref 101–111)
CO2: 25 mmol/L (ref 22–32)
CREATININE: 0.93 mg/dL (ref 0.61–1.24)
Calcium: 9.4 mg/dL (ref 8.9–10.3)
Glucose, Bld: 107 mg/dL — ABNORMAL HIGH (ref 65–99)
Potassium: 4 mmol/L (ref 3.5–5.1)
Sodium: 140 mmol/L (ref 135–145)
Total Bilirubin: 1.1 mg/dL (ref 0.3–1.2)
Total Protein: 6.6 g/dL (ref 6.5–8.1)

## 2017-11-09 LAB — CBC
HCT: 40 % (ref 39.0–52.0)
HEMOGLOBIN: 13.1 g/dL (ref 13.0–17.0)
MCH: 28.7 pg (ref 26.0–34.0)
MCHC: 32.8 g/dL (ref 30.0–36.0)
MCV: 87.7 fL (ref 78.0–100.0)
PLATELETS: 158 10*3/uL (ref 150–400)
RBC: 4.56 MIL/uL (ref 4.22–5.81)
RDW: 13.2 % (ref 11.5–15.5)
WBC: 6.5 10*3/uL (ref 4.0–10.5)

## 2017-11-09 LAB — I-STAT CHEM 8, ED
BUN: 11 mg/dL (ref 6–20)
CREATININE: 0.8 mg/dL (ref 0.61–1.24)
Calcium, Ion: 1.22 mmol/L (ref 1.15–1.40)
Chloride: 107 mmol/L (ref 101–111)
Glucose, Bld: 103 mg/dL — ABNORMAL HIGH (ref 65–99)
HEMATOCRIT: 39 % (ref 39.0–52.0)
HEMOGLOBIN: 13.3 g/dL (ref 13.0–17.0)
POTASSIUM: 3.9 mmol/L (ref 3.5–5.1)
SODIUM: 142 mmol/L (ref 135–145)
TCO2: 23 mmol/L (ref 22–32)

## 2017-11-09 LAB — DIFFERENTIAL
Abs Immature Granulocytes: 0 10*3/uL (ref 0.0–0.1)
BASOS PCT: 0 %
Basophils Absolute: 0 10*3/uL (ref 0.0–0.1)
EOS ABS: 0.4 10*3/uL (ref 0.0–0.7)
EOS PCT: 6 %
Immature Granulocytes: 0 %
Lymphocytes Relative: 19 %
Lymphs Abs: 1.2 10*3/uL (ref 0.7–4.0)
MONO ABS: 0.6 10*3/uL (ref 0.1–1.0)
Monocytes Relative: 9 %
NEUTROS ABS: 4.3 10*3/uL (ref 1.7–7.7)
NEUTROS PCT: 66 %

## 2017-11-09 LAB — HEMOGLOBIN A1C
HEMOGLOBIN A1C: 5.6 % (ref 4.8–5.6)
Mean Plasma Glucose: 114.02 mg/dL

## 2017-11-09 LAB — APTT: APTT: 28 s (ref 24–36)

## 2017-11-09 LAB — ETHANOL

## 2017-11-09 LAB — I-STAT TROPONIN, ED: TROPONIN I, POC: 0 ng/mL (ref 0.00–0.08)

## 2017-11-09 LAB — PROTIME-INR
INR: 1.05
PROTHROMBIN TIME: 13.6 s (ref 11.4–15.2)

## 2017-11-09 MED ORDER — ONDANSETRON HCL 4 MG PO TABS: 4.0000 mg | ORAL_TABLET | Freq: Four times a day (QID) | ORAL | Status: DC | PRN

## 2017-11-09 MED ORDER — BRIMONIDINE TARTRATE 0.2 % OP SOLN
1.0000 [drp] | Freq: Two times a day (BID) | OPHTHALMIC | Status: DC
  Administered 2017-11-09 – 2017-11-10 (×2): 1 [drp] via OPHTHALMIC
  Filled 2017-11-09: qty 5

## 2017-11-09 MED ORDER — LEVOTHYROXINE SODIUM 75 MCG PO TABS
75.0000 ug | ORAL_TABLET | Freq: Every day | ORAL | Status: DC
  Administered 2017-11-10: 75 ug via ORAL
  Filled 2017-11-09: qty 1

## 2017-11-09 MED ORDER — LATANOPROST 0.005 % OP SOLN
1.0000 [drp] | Freq: Every day | OPHTHALMIC | Status: DC
  Filled 2017-11-09 (×2): qty 2.5

## 2017-11-09 MED ORDER — SENNA 8.6 MG PO TABS: 1.0000 | ORAL_TABLET | Freq: Every day | ORAL | Status: DC | PRN

## 2017-11-09 MED ORDER — CLOPIDOGREL BISULFATE 300 MG PO TABS
300.0000 mg | ORAL_TABLET | Freq: Once | ORAL | Status: DC
  Filled 2017-11-09: qty 1

## 2017-11-09 MED ORDER — DORZOLAMIDE HCL-TIMOLOL MAL 2-0.5 % OP SOLN
1.0000 [drp] | Freq: Two times a day (BID) | OPHTHALMIC | Status: DC
  Administered 2017-11-09 – 2017-11-10 (×2): 1 [drp] via OPHTHALMIC
  Filled 2017-11-09: qty 10

## 2017-11-09 MED ORDER — ACETAMINOPHEN 650 MG RE SUPP: 650.0000 mg | Freq: Four times a day (QID) | RECTAL | Status: DC | PRN

## 2017-11-09 MED ORDER — CLOPIDOGREL BISULFATE 75 MG PO TABS
75.0000 mg | ORAL_TABLET | Freq: Every day | ORAL | Status: DC
  Administered 2017-11-10: 75 mg via ORAL
  Filled 2017-11-09 (×2): qty 1

## 2017-11-09 MED ORDER — STROKE: EARLY STAGES OF RECOVERY BOOK
Freq: Once | Status: DC
  Filled 2017-11-09: qty 1

## 2017-11-09 MED ORDER — ASPIRIN 325 MG PO TABS
325.0000 mg | ORAL_TABLET | Freq: Every day | ORAL | Status: DC
  Administered 2017-11-10: 325 mg via ORAL
  Filled 2017-11-09: qty 1

## 2017-11-09 MED ORDER — IOPAMIDOL (ISOVUE-370) INJECTION 76%
INTRAVENOUS | Status: AC
  Filled 2017-11-09: qty 50

## 2017-11-09 MED ORDER — IOPAMIDOL (ISOVUE-370) INJECTION 76%
50.0000 mL | Freq: Once | INTRAVENOUS | Status: AC | PRN
  Administered 2017-11-09: 50 mL via INTRAVENOUS

## 2017-11-09 MED ORDER — ENOXAPARIN SODIUM 40 MG/0.4ML ~~LOC~~ SOLN: 40.0000 mg | SUBCUTANEOUS | Status: DC

## 2017-11-09 MED ORDER — ACETAMINOPHEN 325 MG PO TABS: 650.0000 mg | ORAL_TABLET | Freq: Four times a day (QID) | ORAL | Status: DC | PRN

## 2017-11-09 MED ORDER — ONDANSETRON HCL 4 MG/2ML IJ SOLN: 4.0000 mg | Freq: Four times a day (QID) | INTRAMUSCULAR | Status: DC | PRN

## 2017-11-09 NOTE — H&P (Addendum)
Date: 11/09/2017               Patient Name:  Brendan Lin MRN: [id number]  DOB: May 02, 1932 Age / Sex: 84 y.o., male   PCP: [facility]         Medical Service: Internal Medicine Teaching Service         Attending Physician: Dr. Dorie Rank, MD    First Contact: Dr. Lars Mage Pager: [phone]  Second Contact: Dr. Velna Ochs Pager: [phone]       After Hours (After 5p/  First Contact Pager: [phone]  weekends / holidays): Second Contact Pager: [phone]   Chief Complaint: Right facial and upper extremity tingling  History of Present Illness: Brendan Lin is a 9 m with prostate cancer and thyroid disease presented to emergency department with sudden onset of tingling on entire right upper extremity and right side of mouth at around 10am 11/09/17. No facial droop/drooling, weakness, and has not had any difficulty with his speech.   Denies any chest pain, sob, headache, nausea/vomiting, or tremors. The patient mentions that he has not had any prior strokes.  ED Course: Code stroke called. NIHSS 0 Neuro evaluated and did not feel that patient is TPA candidate CT head showed left thalamic and left posterior limb internal capsule acute infarct with small vessel disease  CTA head and neck: no intracranial or extracranial stenosis, no dissection or large vessel occlusion  Meds:  Current Meds  Medication Sig  . levothyroxine (SYNTHROID, LEVOTHROID) 75 MCG tablet Take 75 mcg by mouth daily before breakfast.  . timolol (BETIMOL) 0.25 % ophthalmic solution Place 1-2 drops into both eyes 2 (two) times daily. 3 gtts both etyes   Allergies: Allergies as of 11/09/2017 - Review Complete 11/09/2017  Allergen Reaction Noted  . Sulfa antibiotics  12/13/2015   Past Medical History:  Diagnosis Date  . Prostate cancer ([place])   . Thyroid disease   . Tubular adenoma of colon    found during colonoscopy 2016    Family History:   Brother stroke in 79s Mother mini strokes  in 48s, heart disease Father prostate can 57y.o  Social History:   Social History   Socioeconomic History  . Marital status: Single    Spouse name: Not on file  . Number of children: Not on file  . Years of education: Not on file  . Highest education level: Not on file  Occupational History  . Occupation: Retired    Comment: Research scientist (physical sciences)  . Financial resource strain: Not on file  . Food insecurity:    Worry: Not on file    Inability: Not on file  . Transportation needs:    Medical: Not on file    Non-medical: Not on file  Tobacco Use  . Smoking status: Former Smoker    Packs/day: 0.00    Years: 8.00    Pack years: 0.00    Types: Pipe  . Smokeless tobacco: Never Used  Substance and Sexual Activity  . Alcohol use: No    Alcohol/week: 0.0 oz  . Drug use: No  . Sexual activity: Not on file  Lifestyle  . Physical activity:    Days per week: Not on file    Minutes per session: Not on file  . Stress: Not on file  Relationships  . Social connections:    Talks on phone: Not on file    Gets together: Not on file    Attends religious service:  Not on file    Active member of club or organization: Not on file    Attends meetings of clubs or organizations: Not on file    Relationship status: Not on file  Other Topics Concern  . Not on file  Social History Narrative  . Not on file   Smoked from 5 yrs of age to 55 yrs No illicit drugs No alcohol uyse  Review of Systems: A complete ROS was negative except as per HPI.   Physical Exam: Blood pressure 133/78, pulse (!) 50, temperature 98.7 F (37.1 C), temperature source Oral, resp. rate 20, SpO2 98 %.  Physical Exam  Constitutional: He is oriented to person, place, and time. He appears well-developed and well-nourished. No distress.  HENT:  Head: Normocephalic and atraumatic.  Eyes: Conjunctivae are normal.  Cardiovascular: Normal rate, regular rhythm and normal heart sounds.  Respiratory: Effort  normal and breath sounds normal. No respiratory distress. He has no wheezes.  GI: Soft. Bowel sounds are normal. He exhibits no distension. There is no tenderness.  Musculoskeletal: He exhibits no edema.  5/5 muscle strength in bilateral upper and lower extremity  Neurological: He is alert and oriented to person, place, and time. He displays normal reflexes. No cranial nerve deficit. He exhibits normal muscle tone. Coordination normal.  Decreased sensation in lower right face and right upper extremity  Skin: He is not diaphoretic.  Psychiatric: He has a normal mood and affect. His behavior is normal. Judgment and thought content normal.    EKG: personally reviewed my interpretation is sinus rhythm, prolonged pr interval, no st or t wave abnormalities  CXR: not done  CT head showed left thalamic and left posterior limb internal capsule acute infarct with small vessel disease   CTA head and neck: no intracranial or extracranial stenosis, no dissection or large vessel occlusion  Assessment & Plan by Problem:  Brendan Lin is a 84 y.o m with history of prostate cancer and thyroid disease who presented a few hours after he noted right facial and upper extremity numbness. Ct head showed infarcts in the left thalamic and left internal capsular regions.   Left thalamic and left internal capsular stroke The patient presented to the ED with a 2 hr history of right lower face and upper extremity numbness and tingling. The patient was found to have left thalamic and left internal capsular infarcts noted on CT head. The patient does not have significant stroke risk factors other than age and history of his mother and brother having strokes.   -NIH stroke screen 0 and therefore did not get TPA -MRI brain -CTA head and neck did not show intra or extracranial stenosis -Lipid panel pending -HbA1C pending -TTE pending -PT/OT/SLP pending -Appreciate neurology following -Asprin  Adenocarcinoma of  prostate [id number] The patient has a gleason's score of 3+4. Most recent radiation was done from 04/10/16-06/05/16. The patient denies any dysuria.   Dispo: Admit patient to Inpatient with expected length of stay greater than 2 midnights.  SignedLars Mage, MD 11/09/2017, 1:58 PM  Pager: [phone]

## 2017-11-09 NOTE — ED Notes (Signed)
Activated code stroke per CRN and Dr. Darl Householder.

## 2017-11-09 NOTE — ED Notes (Signed)
HH tray ordered 

## 2017-11-09 NOTE — Consult Note (Addendum)
Referring Physician: ER Consult requested for: Code Stroke    Chief Complaint: Rt side numbness  HPI: Brendan Lin is an 84 y.o. male who is very healthy at baseline, no comorbidity and very little medical history. He tells me he basically eats vegetarian, does 20 sit ups/pushups and walks 35mi a day. mRS 0. Today he presented via POV to the emergency room for evaluation of sudden onset of right arm and face numbness and tingling.  Patient states this started around 10 AM, he presented to the ER Triage about 1230 and Code Stroke was activated as he was emergently brought to CT. He was emergently evaluated in CT where symptoms were rapidly improving, NIHSS 0.  Patient denies any trouble with his speech.  He denies any weakness or facial droop.  Denies any trouble with his balance or vision changes. There was no LVO and no tPA given due to resolution of symptoms.  Date last known well: 11/09/17 Time last known well: 10am  tPA Given: no, resolution of symptoms  Past Medical History Past Medical History:  Diagnosis Date  . Prostate cancer ([place])   . Thyroid disease   . Tubular adenoma of colon    found during colonoscopy 2016    Surgical History Past Surgical History:  Procedure Laterality Date  . COLONOSCOPY    . INGUINAL HERNIA REPAIR Left   . PROSTATE BIOPSY      Family History  History reviewed. No pertinent family history.  Social History:   reports that he has quit smoking. His smoking use included pipe. He smoked 0.00 packs per day for 8.00 years. He has never used smokeless tobacco. He reports that he does not drink alcohol or use drugs.  Allergies:  Allergies  Allergen Reactions  . Sulfa Antibiotics     Home Medications:   (Not in a hospital admission)  Hospital Medications . iopamidol        ROS:  History obtained from Pt  General ROS: negative for - chills, fatigue, fever, night sweats, weight gain or weight loss Psychological ROS: negative for - behavioral  disorder, hallucinations, memory difficulties, mood swings or suicidal ideation Ophthalmic ROS: negative for - blurry vision, double vision, eye pain or loss of vision ENT ROS: negative for - epistaxis, nasal discharge, oral lesions, sore throat, tinnitus or vertigo Allergy and Immunology ROS: negative for - hives or itchy/watery eyes Hematological and Lymphatic ROS: negative for - bleeding problems, bruising or swollen lymph nodes Endocrine ROS: negative for - galactorrhea, hair pattern changes, polydipsia/polyuria or temperature intolerance Respiratory ROS: negative for - cough, hemoptysis, shortness of breath or wheezing Cardiovascular ROS: negative for - chest pain, dyspnea on exertion, edema or irregular heartbeat Gastrointestinal ROS: negative for - abdominal pain, diarrhea, hematemesis, nausea/vomiting or stool incontinence Genito-Urinary ROS: negative for - dysuria, hematuria, incontinence or urinary frequency/urgency Musculoskeletal ROS: negative for - joint swelling or muscular weakness Neurological ROS: as noted in HPI Dermatological ROS: negative for rash and skin lesion changes   Physical Examination:  Vitals:   11/09/17 1300 11/09/17 1317 11/09/17 1330 11/09/17 1345  BP:  131/78 128/86 133/78  Pulse: 61 74 66 (!) 50  Resp:  19 17 20   Temp:      TempSrc:      SpO2: 100% 99% 99% 98%    General - well nourished, appears younger and more fit than stated age Heart - Regular rate and rhythm - no murmer appreciated Lungs - Clear to auscultation  Abdomen - Soft - non  tender Extremities - Distal pulses intact - no edema Skin - Warm and dry   Neurologic Examination:  Mental Status: Alert, oriented, thought content appropriate.  Speech fluent without evidence of aphasia. Able to follow 3 step commands without difficulty. Cranial Nerves: II: Discs not visualized; Visual fields grossly normal, pupils equal, round, reactive to light III,IV, VI: ptosis not present, extra-ocular  motions intact bilaterally V,VII: smile symmetric, facial light touch sensation normal bilaterally VIII: hearing normal bilaterally IX,X: gag reflex present XI: bilateral shoulder shrug XII: midline tongue extension Motor: RUE - 5/5    LUE - 5/5   RLE - 5/5    LLE - 5/5 Tone and bulk:normal tone throughout; no atrophy noted Sensory: Light touch intact throughout, bilaterally Deep Tendon Reflexes: 2+ and symmetric throughout Plantars: Right: downgoing   Left: downgoing Cerebellar: normal finger-to-nose and normal heel-to-shin test Gait: deferred at this time.   NIHSS 1a Level of Conscious: 1b LOC Questions:  1c LOC Commands:  2 Best Gaze:  3 Visual:  4 Facial Palsy:  5a Motor Arm - left:  5b Motor Arm - Right:  6a Motor Leg - Left:  6b Motor Leg - Right:  7 Limb Ataxia:  8 Sensory:  9 Best Language:  10 Dysarthria: 11 Extinct. and Inattention: TOTAL: 0   LABORATORY STUDIES:  Basic Metabolic Panel: Recent Labs  Lab 11/09/17 1230 11/09/17 1236  NA 140 142  K 4.0 3.9  CL 109 107  CO2 25  --   GLUCOSE 107* 103*  BUN 10 11  CREATININE 0.93 0.80  CALCIUM 9.4  --     Liver Function Tests: Recent Labs  Lab 11/09/17 1230  AST 18  ALT 11*  ALKPHOS 45  BILITOT 1.1  PROT 6.6  ALBUMIN 3.9   CBC: Recent Labs  Lab 11/09/17 1230 11/09/17 1236  WBC 6.5  --   NEUTROABS 4.3  --   HGB 13.1 13.3  HCT 40.0 39.0  MCV 87.7  --   PLT 158  --     Coagulation Studies: Recent Labs    11/09/17 1230  LABPROT 13.6  INR 1.05    IMAGING Reviewed: Ct Angio Head W Or Wo Contrast  Result Date: 11/09/2017 CLINICAL DATA:  RIGHT-sided face and arm numbness. EXAM: CT ANGIOGRAPHY HEAD AND NECK TECHNIQUE: Multidetector CT imaging of the head and neck was performed using the standard protocol during bolus administration of intravenous contrast. Multiplanar CT image reconstructions and MIPs were obtained to evaluate the vascular anatomy. Carotid stenosis measurements  (when applicable) are obtained utilizing NASCET criteria, using the distal internal carotid diameter as the denominator. CONTRAST:  17mL ISOVUE-370 IOPAMIDOL (ISOVUE-370) INJECTION 76% COMPARISON:  Noncontrast CT head earlier today. FINDINGS: CTA NECK Aortic arch: Variant branching, direct origin of the LEFT vertebral from the arch. Imaged portion shows no evidence of aneurysm or dissection. No significant stenosis of the major arch vessel origins. Right carotid system: No evidence of dissection, stenosis (50% or greater) or occlusion. Left carotid system: No evidence of dissection, stenosis (50% or greater) or occlusion. Vertebral arteries: RIGHT vertebral dominant, both are patent. No evidence of dissection, stenosis (50% or greater) or occlusion. Nonvascular soft tissues: No neck masses. Spondylosis. Poor dentition. No lung apex lesion or pneumothorax. CTA HEAD Anterior circulation: Internal carotid arteries demonstrate only minor calcific atheromatous change. No significant stenosis, proximal occlusion, aneurysm, or vascular malformation. Posterior circulation: Both vertebrals contribute to formation of basilar, RIGHT dominant. No significant stenosis, proximal occlusion, aneurysm, or vascular malformation. Venous  sinuses: As permitted by contrast timing, patent. Anatomic variants: None of significance. Delayed phase:   No abnormal intracranial enhancement. Review of the MIP images confirms the above findings IMPRESSION: No intracranial or extracranial stenosis of significance. No dissection or large vessel occlusion. Electronically Signed   By: Staci Righter M.D.   On: 11/09/2017 13:10   Ct Angio Neck W Or Wo Contrast  Result Date: 11/09/2017 CLINICAL DATA:  RIGHT-sided face and arm numbness. EXAM: CT ANGIOGRAPHY HEAD AND NECK TECHNIQUE: Multidetector CT imaging of the head and neck was performed using the standard protocol during bolus administration of intravenous contrast. Multiplanar CT image  reconstructions and MIPs were obtained to evaluate the vascular anatomy. Carotid stenosis measurements (when applicable) are obtained utilizing NASCET criteria, using the distal internal carotid diameter as the denominator. CONTRAST:  34mL ISOVUE-370 IOPAMIDOL (ISOVUE-370) INJECTION 76% COMPARISON:  Noncontrast CT head earlier today. FINDINGS: CTA NECK Aortic arch: Variant branching, direct origin of the LEFT vertebral from the arch. Imaged portion shows no evidence of aneurysm or dissection. No significant stenosis of the major arch vessel origins. Right carotid system: No evidence of dissection, stenosis (50% or greater) or occlusion. Left carotid system: No evidence of dissection, stenosis (50% or greater) or occlusion. Vertebral arteries: RIGHT vertebral dominant, both are patent. No evidence of dissection, stenosis (50% or greater) or occlusion. Nonvascular soft tissues: No neck masses. Spondylosis. Poor dentition. No lung apex lesion or pneumothorax. CTA HEAD Anterior circulation: Internal carotid arteries demonstrate only minor calcific atheromatous change. No significant stenosis, proximal occlusion, aneurysm, or vascular malformation. Posterior circulation: Both vertebrals contribute to formation of basilar, RIGHT dominant. No significant stenosis, proximal occlusion, aneurysm, or vascular malformation. Venous sinuses: As permitted by contrast timing, patent. Anatomic variants: None of significance. Delayed phase:   No abnormal intracranial enhancement. Review of the MIP images confirms the above findings IMPRESSION: No intracranial or extracranial stenosis of significance. No dissection or large vessel occlusion. Electronically Signed   By: Staci Righter M.D.   On: 11/09/2017 13:10   Ct Head Code Stroke Wo Contrast  Result Date: 11/09/2017 CLINICAL DATA:  Code stroke. RIGHT-sided facial and arm numbness. Last seen normal 2 hours ago. EXAM: CT HEAD WITHOUT CONTRAST TECHNIQUE: Contiguous axial images  were obtained from the base of the skull through the vertex without intravenous contrast. COMPARISON:  None. FINDINGS: Brain: Asymmetric hypoattenuation of LEFT thalamus and posterior limb internal capsule, possible acute infarction. No other definite areas of cortical or deep nuclei asymmetry. No hemorrhage, mass lesion, hydrocephalus, or extra-axial fluid. Generalized atrophy. Extensive white matter disease. Vascular: Calcification of the cavernous internal carotid arteries consistent with cerebrovascular atherosclerotic disease. No signs of intracranial large vessel occlusion. Skull: Calvarium intact. Maxillary lucencies incompletely evaluated suspicious for periodontal disease. Sinuses/Orbits: No sinus disease.  Negative orbits. Other: Mastoid air cells are clear. ASPECTS [facility] Stroke Program Early CT Score) - Ganglionic level infarction (caudate, lentiform nuclei, internal capsule, insula, M1-M3 cortex): 6 - Supraganglionic infarction (M4-M6 cortex): 3 Total score (0-10 with 10 being normal): 9 IMPRESSION: 1. Query LEFT thalamus/LEFT posterior limb internal capsule acute infarct. Atrophy and small vessel disease. 2. ASPECTS is 9. These results were communicated to Dr. Lorraine Lax at 12:46 pmon 5/25/2019by text page via the [facility] messaging system. Electronically Signed   By: Staci Righter M.D.   On: 11/09/2017 12:49    Assessment: 84 y.o. male who presented with right side numbness, now resolved.  Stroke Risk Factors - age only. Otherwise pt is very active and eats heart  healthy and has no comorbidity.  # Acute Ischemic stroke- likely a lacunar type infarct. Early CTH changes seen possibly in Lt thalamus/post limb of the internal capsule. MRI brain ordered for better etiology  -CTA head/neck: no stenosis or LVO # Right side numbness- d/t stroke, now resolved  Plan:  HgbA1c, fasting lipid panel added onto ER blood already collected  MRI brain ordered  PT consult, OT consult  no need for speech  consult. I have evaluated at bedside and he is safe to swallow.  Heart healthy diet  Echocardiogram ordered  Prophylactic therapy  Statin Therapy pending work up  ASA now and daily  Risk factor modification  Telemetry monitoring  Frequent neuro checks  Fall Precautions  NEUROHOSPITALIST ADDENDUM Seen and examined the patient today. Suspect lacunar left thalamic stroke. Start DAPT ( ASA + Plavix with 300mg  load)  for TIA/minor stroke - CHANCE, POINT trials I have reviewed the contents of history and physical exam as documented by PA/ARNP/Resident and agree with above documentation.  I have discussed and formulated the above plan as documented. Edits to the note have been made as needed.    Karena Addison Chidiebere Wynn MD [facility] [phone]   If 7pm to 7am, please call on call as listed on AMION.

## 2017-11-09 NOTE — ED Notes (Addendum)
Secretary paging Admitting MD to inquire about diet order d/t Claiborne Billings, RN, 3W, advised room assignment has been changed to [phone] and it is being cleaned. Advised will call when ready.

## 2017-11-09 NOTE — ED Provider Notes (Signed)
[facility] EMERGENCY DEPARTMENT Provider Note   CSN: [id number] Arrival date & time: 11/09/17  1145     History   Chief Complaint Chief Complaint  Patient presents with  . Code Stroke  . Numbness    HPI Brendan Lin is a 84 y.o. male.  HPI Patient presented to the emergency room for evaluation of numbness and tingling.  Patient states that around 10 AM he felt sudden onset of tingling throughout his entire right arm.  He also noticed some tingling on the right side of his mouth.  They both felt numb.  Patient presented to the emergency room at about 1230.  He was seen by another provider at triage and code stroke was activated.  Patient denies any trouble with his speech.  He denies any weakness.  Denies any trouble with his balance. Past Medical History:  Diagnosis Date  . Prostate cancer ([place])   . Thyroid disease   . Tubular adenoma of colon    found during colonoscopy 2016    Patient Active Problem List   Diagnosis Date Noted  . Malignant neoplasm of prostate ([place]) 03/23/2016    Past Surgical History:  Procedure Laterality Date  . COLONOSCOPY    . INGUINAL HERNIA REPAIR Left   . PROSTATE BIOPSY          Home Medications    Prior to Admission medications   Medication Sig Start Date End Date Taking? Authorizing Provider  levothyroxine (SYNTHROID, LEVOTHROID) 75 MCG tablet Take 75 mcg by mouth daily before breakfast.    [provider]  timolol (BETIMOL) 0.25 % ophthalmic solution Place 1-2 drops into both eyes 2 (two) times daily. 3 gtts both etyes    [provider]    Family History History reviewed. No pertinent family history.  Social History Social History   Tobacco Use  . Smoking status: Former Smoker    Packs/day: 0.00    Years: 8.00    Pack years: 0.00    Types: Pipe  . Smokeless tobacco: Never Used  Substance Use Topics  . Alcohol use: No    Alcohol/week: 0.0 oz  . Drug use: No     Allergies     Sulfa antibiotics   Review of Systems Review of Systems  All other systems reviewed and are negative.    Physical Exam Updated Vital Signs BP 133/78   Pulse (!) 50   Temp 98.7 F (37.1 C) (Oral)   Resp 20   SpO2 98%   Physical Exam  Constitutional: He is oriented to person, place, and time. He appears well-developed and well-nourished. No distress.  HENT:  Head: Normocephalic and atraumatic.  Right Ear: External ear normal.  Left Ear: External ear normal.  Mouth/Throat: Oropharynx is clear and moist.  Eyes: Conjunctivae are normal. Right eye exhibits no discharge. Left eye exhibits no discharge. No scleral icterus.  Neck: Neck supple. No tracheal deviation present.  Cardiovascular: Normal rate, regular rhythm and intact distal pulses.  Pulmonary/Chest: Effort normal and breath sounds normal. No stridor. No respiratory distress. He has no wheezes. He has no rales.  Abdominal: Soft. Bowel sounds are normal. He exhibits no distension. There is no tenderness. There is no rebound and no guarding.  Musculoskeletal: He exhibits no edema or tenderness.  Neurological: He is alert and oriented to person, place, and time. He has normal strength. A sensory deficit is present. No cranial nerve deficit (No facial droop, extraocular movements intact, tongue midline ). He exhibits  normal muscle tone. He displays no seizure activity. Coordination normal.  No pronator drift bilateral upper extrem, able to hold both legs off bed for 5 seconds, sensation intact in all extremities however does feel different on the right side of his face and his right upper extremity, no visual field cuts, no left or right sided neglect, normal finger-nose exam bilaterally, no nystagmus noted   Skin: Skin is warm and dry. No rash noted.  Psychiatric: He has a normal mood and affect.  Nursing note and vitals reviewed.    ED Treatments / Results  Labs (all labs ordered are listed, but only abnormal results are  displayed) Labs Reviewed  COMPREHENSIVE METABOLIC PANEL - Abnormal; Notable for the following components:      Result Value   Glucose, Bld 107 (*)    ALT 11 (*)    All other components within normal limits  I-STAT CHEM 8, ED - Abnormal; Notable for the following components:   Glucose, Bld 103 (*)    All other components within normal limits  ETHANOL  PROTIME-INR  APTT  CBC  DIFFERENTIAL  RAPID URINE DRUG SCREEN, HOSP PERFORMED  URINALYSIS, ROUTINE W REFLEX MICROSCOPIC  I-STAT TROPONIN, ED    EKG None  Radiology Ct Angio Head W Or Wo Contrast  Result Date: 11/09/2017 CLINICAL DATA:  RIGHT-sided face and arm numbness. EXAM: CT ANGIOGRAPHY HEAD AND NECK TECHNIQUE: Multidetector CT imaging of the head and neck was performed using the standard protocol during bolus administration of intravenous contrast. Multiplanar CT image reconstructions and MIPs were obtained to evaluate the vascular anatomy. Carotid stenosis measurements (when applicable) are obtained utilizing NASCET criteria, using the distal internal carotid diameter as the denominator. CONTRAST:  70mL ISOVUE-370 IOPAMIDOL (ISOVUE-370) INJECTION 76% COMPARISON:  Noncontrast CT head earlier today. FINDINGS: CTA NECK Aortic arch: Variant branching, direct origin of the LEFT vertebral from the arch. Imaged portion shows no evidence of aneurysm or dissection. No significant stenosis of the major arch vessel origins. Right carotid system: No evidence of dissection, stenosis (50% or greater) or occlusion. Left carotid system: No evidence of dissection, stenosis (50% or greater) or occlusion. Vertebral arteries: RIGHT vertebral dominant, both are patent. No evidence of dissection, stenosis (50% or greater) or occlusion. Nonvascular soft tissues: No neck masses. Spondylosis. Poor dentition. No lung apex lesion or pneumothorax. CTA HEAD Anterior circulation: Internal carotid arteries demonstrate only minor calcific atheromatous change. No  significant stenosis, proximal occlusion, aneurysm, or vascular malformation. Posterior circulation: Both vertebrals contribute to formation of basilar, RIGHT dominant. No significant stenosis, proximal occlusion, aneurysm, or vascular malformation. Venous sinuses: As permitted by contrast timing, patent. Anatomic variants: None of significance. Delayed phase:   No abnormal intracranial enhancement. Review of the MIP images confirms the above findings IMPRESSION: No intracranial or extracranial stenosis of significance. No dissection or large vessel occlusion. Electronically Signed   By: Staci Righter M.D.   On: 11/09/2017 13:10   Ct Angio Neck W Or Wo Contrast  Result Date: 11/09/2017 CLINICAL DATA:  RIGHT-sided face and arm numbness. EXAM: CT ANGIOGRAPHY HEAD AND NECK TECHNIQUE: Multidetector CT imaging of the head and neck was performed using the standard protocol during bolus administration of intravenous contrast. Multiplanar CT image reconstructions and MIPs were obtained to evaluate the vascular anatomy. Carotid stenosis measurements (when applicable) are obtained utilizing NASCET criteria, using the distal internal carotid diameter as the denominator. CONTRAST:  48mL ISOVUE-370 IOPAMIDOL (ISOVUE-370) INJECTION 76% COMPARISON:  Noncontrast CT head earlier today. FINDINGS: CTA NECK Aortic  arch: Variant branching, direct origin of the LEFT vertebral from the arch. Imaged portion shows no evidence of aneurysm or dissection. No significant stenosis of the major arch vessel origins. Right carotid system: No evidence of dissection, stenosis (50% or greater) or occlusion. Left carotid system: No evidence of dissection, stenosis (50% or greater) or occlusion. Vertebral arteries: RIGHT vertebral dominant, both are patent. No evidence of dissection, stenosis (50% or greater) or occlusion. Nonvascular soft tissues: No neck masses. Spondylosis. Poor dentition. No lung apex lesion or pneumothorax. CTA HEAD Anterior  circulation: Internal carotid arteries demonstrate only minor calcific atheromatous change. No significant stenosis, proximal occlusion, aneurysm, or vascular malformation. Posterior circulation: Both vertebrals contribute to formation of basilar, RIGHT dominant. No significant stenosis, proximal occlusion, aneurysm, or vascular malformation. Venous sinuses: As permitted by contrast timing, patent. Anatomic variants: None of significance. Delayed phase:   No abnormal intracranial enhancement. Review of the MIP images confirms the above findings IMPRESSION: No intracranial or extracranial stenosis of significance. No dissection or large vessel occlusion. Electronically Signed   By: Staci Righter M.D.   On: 11/09/2017 13:10   Ct Head Code Stroke Wo Contrast  Result Date: 11/09/2017 CLINICAL DATA:  Code stroke. RIGHT-sided facial and arm numbness. Last seen normal 2 hours ago. EXAM: CT HEAD WITHOUT CONTRAST TECHNIQUE: Contiguous axial images were obtained from the base of the skull through the vertex without intravenous contrast. COMPARISON:  None. FINDINGS: Brain: Asymmetric hypoattenuation of LEFT thalamus and posterior limb internal capsule, possible acute infarction. No other definite areas of cortical or deep nuclei asymmetry. No hemorrhage, mass lesion, hydrocephalus, or extra-axial fluid. Generalized atrophy. Extensive white matter disease. Vascular: Calcification of the cavernous internal carotid arteries consistent with cerebrovascular atherosclerotic disease. No signs of intracranial large vessel occlusion. Skull: Calvarium intact. Maxillary lucencies incompletely evaluated suspicious for periodontal disease. Sinuses/Orbits: No sinus disease.  Negative orbits. Other: Mastoid air cells are clear. ASPECTS [facility] Stroke Program Early CT Score) - Ganglionic level infarction (caudate, lentiform nuclei, internal capsule, insula, M1-M3 cortex): 6 - Supraganglionic infarction (M4-M6 cortex): 3 Total score (0-10  with 10 being normal): 9 IMPRESSION: 1. Query LEFT thalamus/LEFT posterior limb internal capsule acute infarct. Atrophy and small vessel disease. 2. ASPECTS is 9. These results were communicated to Dr. Lorraine Lax at 12:46 pmon 5/25/2019by text page via the [facility] messaging system. Electronically Signed   By: Staci Righter M.D.   On: 11/09/2017 12:49    Procedures Procedures (including critical care time)  Medications Ordered in ED Medications  iopamidol (ISOVUE-370) 76 % injection (has no administration in time range)  iopamidol (ISOVUE-370) 76 % injection 50 mL (50 mLs Intravenous Contrast Given 11/09/17 1255)     Initial Impression / Assessment and Plan / ED Course  I have reviewed the triage vital signs and the nursing notes.  Pertinent labs & imaging results that were available during my care of the patient were reviewed by me and considered in my medical decision making (see chart for details).  Clinical Course as of Nov 09 1349  Sat Nov 09, 2017  1306 Discussed with neurology team.  No indication for TPA right now.  Medical admission and further stroke, TIA workup.   [SW]  5462 Labs reviewed.  No significant abnormalities.  CT scan with possible stroke.     [JK]    Clinical Course User Index [JK] Dorie Rank, MD   Pt presents with acute neurologic sx.  Deficits are minor right now.  No indication for TPA.  CT with  possible stroke.  Will admit for further workup.  Final Clinical Impressions(s) / ED Diagnoses   Final diagnoses:  Cerebral infarction, unspecified mechanism ([place])      Dorie Rank, MD 11/09/17 1352

## 2017-11-09 NOTE — ED Notes (Signed)
Patient transported to MRI 

## 2017-11-09 NOTE — ED Provider Notes (Signed)
MSE was initiated and I personally evaluated the patient and placed orders (if any) at  12:23 PM on Nov 09, 2017.  The patient appears stable so that the remainder of the MSE may be completed by another provider.  84 year old male with a history of prostate cancer status post radiation presenting with right arm and right sided facial tingling.  Last known normal 10 AM.  He states that he was ironing his close when he noticed the tingling throughout his entire right arm.  He then noticed that the tingling near the right side of his mouth.  On exam, he has some dysmetria with finger-to-nose on the right.  Gait with heel-to-toe, heel walking, and toe walking is ataxic.  Heel-to-shin is intact bilaterally.  He endorses tingling sensation to the right maxillary and mandibular branches of the facial nerve, but motor function remains intact.  Cranial nerves II through XII are otherwise grossly intact.  5 out of 5 strength against resistance of the bilateral upper and lower extremities. EOMs are intact. PERRL.   The patient was seen and evaluated by myself and Dr. Darl Householder.  Code stroke activated.    Joanne Gavel, PA-C 11/09/17 1227    Dorie Rank, MD 11/09/17 1400

## 2017-11-09 NOTE — ED Notes (Signed)
UNABLE to order tray for dinner.  MD still has him NPO.

## 2017-11-09 NOTE — ED Triage Notes (Signed)
Pt reports onset two hours ago of right arm numbness/tingling and some tingling to right side of face/mouth. Pt is ambulatory and no distress noted. Grips equal, no arm drift, no leg weakness noted, speech is clear. Had provider see pt at triage and code stroke was initiated.

## 2017-11-09 NOTE — ED Notes (Addendum)
Pt ambulatory to Pod F7 - family members x 2 w/pt. Pt noted to be alert, oriented, calm, cooperative, pleasant. Monitor applied to pt. VSS. Pt passed Stroke Swallow Screen. Pt aware Diet tray ordered as per report from [place], Therapist, sports. Pt denies any c/o pain/weakness/numbness.

## 2017-11-09 NOTE — ED Notes (Signed)
Diet ordered received.

## 2017-11-09 NOTE — ED Notes (Signed)
Attempted to call report - Secretary advised Camera operator has to Warden/ranger.

## 2017-11-09 NOTE — ED Notes (Signed)
Paged [facility]ENT to [place], [place]

## 2017-11-09 NOTE — Discharge Summary (Signed)
Name: Brendan Lin MRN: [id number] DOB: 1931-07-15 84 y.o. PCP: [facility], [facility]  Date of Admission: 11/09/2017 11:57 AM Date of Discharge: 11/10/17 Attending Physician: Dr. Lalla Brothers  Discharge Diagnosis:  Active Problems:   CVA (cerebral vascular accident) [facility])   Acute thalamic infarction [facility])   Discharge Medications: Allergies as of 11/10/2017      Reactions   Sulfa Antibiotics Rash      Medication List    TAKE these medications   aspirin 81 MG EC tablet Take 1 tablet (81 mg total) by mouth daily for 21 days.   atorvastatin 40 MG tablet Commonly known as:  LIPITOR Take 1 tablet (40 mg total) by mouth daily at 6 PM.   brimonidine 0.2 % ophthalmic solution Commonly known as:  ALPHAGAN Place 1 drop into both eyes 2 (two) times daily.   clopidogrel 75 MG tablet Commonly known as:  PLAVIX Take 1 tablet (75 mg total) by mouth daily.   dorzolamide-timolol 22.3-6.8 MG/ML ophthalmic solution Commonly known as:  COSOPT Place 1 drop into both eyes 2 (two) times daily.   latanoprost 0.005 % ophthalmic solution Commonly known as:  XALATAN Place 1 drop into both eyes at bedtime.   levothyroxine 75 MCG tablet Commonly known as:  SYNTHROID, LEVOTHROID Take 75 mcg by mouth daily before breakfast.       Disposition and follow-up:   Brendan Lin was discharged from [facility] in stable condition.  At the hospital follow up visit please address:  1.  Left thalamic lacunar infarct: please ensure that the patient takes DAPT (aspirin 81mg  and plavix 75mg ) for 3 weeks and then take plavix 75mg  thereafter. The patient should follow with pcp within 2 weeks and with [facility] weeks.  2.  Labs / imaging needed at time of follow-up: none  3.  Pending labs/ test needing follow-up: none  Follow-up Appointments: Follow-up Information    Clinic, [facility]. Schedule an appointment as soon as possible for a visit in 1 week(s).    Contact information: [place] [address] [phone]        [facility]. Schedule an appointment as soon as possible for a visit in 6 week(s).   Contact information: [address]     [address] [place] [address] [place] Course by problem list: Active Problems:   CVA (cerebral vascular accident) [facility])   Acute thalamic infarction [facility])   Left thalamic lacunar infarct Brendan Lin presented with right facial and upper extremity numbness. CT head showed left thalamic and left posterior limb internal capsule infarcts. MRI showed acute nonhemorrhagic linear infarct involving left thalamus and chronic microvascular ischemia.Marland Kitchen CTA head and neck did not reveal and intr or extracranial stenosis. The patient's strokes were thought to be lacunar in nature. Although the patient presented to the hospital within 2 hours she had a NIHSS score of 0 and did not meet requirements for TPA. The patient was started on aspirin 81mg  and plavix 75mg  with a plavix load of 300mg . Physical and occupational therapy recommended home health services. Echo showed lvef of 60-65%, G1DD, no regional wall motion abnormalities, pa pressure of 34mmhg, no pericardial effusion, trivial mitral regurgitation, and nromal cvp. The patient was discharged on DAPT for 3 weeks and then told to take plavix thereafter as the patient was taking aspirin intermittently prior to his stroke. Patient was also told to take lipitor 40mg   daily and was provided home health pt services.  Discharge Vitals:   BP (!) 146/78 (BP Location: Right Arm)   Pulse 61   Temp 98.7 F (37.1 C) (Oral)   Resp 18   Ht 5\' 6"  (1.676 m)   Wt 161 lb 6 oz (73.2 kg)   SpO2 100%   BMI 26.05 kg/m   Pertinent Labs, Studies, and Procedures:   CBC    Component Value Date/Time   WBC 6.5 11/09/2017 1230   RBC 4.56 11/09/2017 1230   HGB 13.3 11/09/2017 1236    HCT 39.0 11/09/2017 1236   PLT 158 11/09/2017 1230   MCV 87.7 11/09/2017 1230   MCH 28.7 11/09/2017 1230   MCHC 32.8 11/09/2017 1230   RDW 13.2 11/09/2017 1230   LYMPHSABS 1.2 11/09/2017 1230   MONOABS 0.6 11/09/2017 1230   EOSABS 0.4 11/09/2017 1230   BASOSABS 0.0 11/09/2017 1230   BMP Latest Ref Rng & Units 11/10/2017 11/09/2017 11/09/2017  Glucose 65 - 99 mg/dL 93 103(H) 107(H)  BUN 6 - 20 mg/dL 10 11 10   Creatinine 0.61 - 1.24 mg/dL 0.84 0.80 0.93  Sodium 135 - 145 mmol/L 139 142 140  Potassium 3.5 - 5.1 mmol/L 4.0 3.9 4.0  Chloride 101 - 111 mmol/L 109 107 109  CO2 22 - 32 mmol/L 22 - 25  Calcium 8.9 - 10.3 mg/dL 9.2 - 9.4   CT head showed left thalamic and left posterior limb internal capsule acute infarct with small vessel disease   CTA head and neck: no intracranial or extracranial stenosis, no dissection or large vessel occlusion     Discharge Instructions: Discharge Instructions    Call MD for:  difficulty breathing, headache or visual disturbances   Complete by:  As directed    Call MD for:  extreme fatigue   Complete by:  As directed    Call MD for:  persistant dizziness or light-headedness   Complete by:  As directed    Call MD for:  persistant nausea and vomiting   Complete by:  As directed    Call MD for:  persistant nausea and vomiting   Complete by:  As directed    Diet - low sodium heart healthy   Complete by:  As directed    Diet - low sodium heart healthy   Complete by:  As directed    Diet - low sodium heart healthy   Complete by:  As directed    Discharge instructions   Complete by:  As directed    It was a pleasure to take care of you Brendan Lin. Please take aspirin and plavix for 3 weeks and then take only plavix thereafter. You should also take lipitor. Please follow up with neurology and your primary care physician. Thank you!   Face-to-face encounter (required for Medicare/Medicaid patients)   Complete by:  As directed    I Terrel Nesheiwat  certify that this patient is under my care and that I, or a nurse practitioner or physician's assistant working with me, had a face-to-face encounter that meets the physician face-to-face encounter requirements with this patient on 11/10/2017. The encounter with the patient was in whole, or in part for the following medical condition(s) which is the primary reason for home health care acute lacunar stroke.   The encounter with the patient was in whole, or in part, for the following medical condition, which is the primary reason for home health care:  acute lacunar stroke   I certify  that, based on my findings, the following services are medically necessary home health services:  Physical therapy   Reason for Medically Necessary Home Health Services:  Therapy- Personnel officer, Public librarian   My clinical findings support the need for the above services:  Unsafe ambulation due to balance issues   Further, I certify that my clinical findings support that this patient is homebound due to:  Unable to leave home safely without assistance   Home Health   Complete by:  As directed    To provide the following care/treatments:   PT OT     Increase activity slowly   Complete by:  As directed    Increase activity slowly   Complete by:  As directed    Increase activity slowly   Complete by:  As directed       Signed: Lars Mage, MD 11/11/2017, 10:40 AM   Pager: [phone]

## 2017-11-10 ENCOUNTER — Observation Stay (HOSPITAL_BASED_OUTPATIENT_CLINIC_OR_DEPARTMENT_OTHER): Payer: Medicare Other

## 2017-11-10 DIAGNOSIS — Z7982 Long term (current) use of aspirin: Secondary | ICD-10-CM | POA: Diagnosis not present

## 2017-11-10 DIAGNOSIS — I6381 Other cerebral infarction due to occlusion or stenosis of small artery: Secondary | ICD-10-CM

## 2017-11-10 DIAGNOSIS — I639 Cerebral infarction, unspecified: Secondary | ICD-10-CM

## 2017-11-10 DIAGNOSIS — Z79899 Other long term (current) drug therapy: Secondary | ICD-10-CM

## 2017-11-10 DIAGNOSIS — Z882 Allergy status to sulfonamides status: Secondary | ICD-10-CM

## 2017-11-10 DIAGNOSIS — Z79890 Hormone replacement therapy (postmenopausal): Secondary | ICD-10-CM | POA: Diagnosis not present

## 2017-11-10 DIAGNOSIS — I361 Nonrheumatic tricuspid (valve) insufficiency: Secondary | ICD-10-CM

## 2017-11-10 LAB — URINALYSIS, ROUTINE W REFLEX MICROSCOPIC
Bilirubin Urine: NEGATIVE
Glucose, UA: NEGATIVE mg/dL
HGB URINE DIPSTICK: NEGATIVE
Ketones, ur: NEGATIVE mg/dL
LEUKOCYTES UA: NEGATIVE
Nitrite: NEGATIVE
PROTEIN: NEGATIVE mg/dL
SPECIFIC GRAVITY, URINE: 1.013 (ref 1.005–1.030)
pH: 6 (ref 5.0–8.0)

## 2017-11-10 LAB — BASIC METABOLIC PANEL
Anion gap: 8 (ref 5–15)
BUN: 10 mg/dL (ref 6–20)
CALCIUM: 9.2 mg/dL (ref 8.9–10.3)
CO2: 22 mmol/L (ref 22–32)
CREATININE: 0.84 mg/dL (ref 0.61–1.24)
Chloride: 109 mmol/L (ref 101–111)
GFR calc Af Amer: >60 mL/min (ref >60)
GLUCOSE: 93 mg/dL (ref 65–99)
POTASSIUM: 4 mmol/L (ref 3.5–5.1)
Sodium: 139 mmol/L (ref 135–145)

## 2017-11-10 LAB — RAPID URINE DRUG SCREEN, HOSP PERFORMED
Amphetamines: NOT DETECTED
BARBITURATES: NOT DETECTED
BENZODIAZEPINES: NOT DETECTED
COCAINE: NOT DETECTED
Opiates: NOT DETECTED
Tetrahydrocannabinol: NOT DETECTED

## 2017-11-10 LAB — ECHOCARDIOGRAM COMPLETE
Height: 66 in
WEIGHTICAEL: 2582.03 [oz_av]

## 2017-11-10 LAB — TSH: TSH: 1.379 u[IU]/mL (ref 0.350–4.500)

## 2017-11-10 MED ORDER — ATORVASTATIN CALCIUM 40 MG PO TABS: 40.0000 mg | ORAL_TABLET | Freq: Every day | ORAL | 1 refills | Status: DC

## 2017-11-10 MED ORDER — ASPIRIN 81 MG PO TBEC: 81.0000 mg | DELAYED_RELEASE_TABLET | Freq: Every day | ORAL | 1 refills | Status: DC

## 2017-11-10 MED ORDER — ASPIRIN EC 325 MG PO TBEC: 325.0000 mg | DELAYED_RELEASE_TABLET | Freq: Every day | ORAL | 0 refills | Status: DC

## 2017-11-10 MED ORDER — ASPIRIN 81 MG PO TBEC: 81.0000 mg | DELAYED_RELEASE_TABLET | Freq: Every day | ORAL | 0 refills | Status: AC

## 2017-11-10 MED ORDER — CLOPIDOGREL BISULFATE 75 MG PO TABS: 75.0000 mg | ORAL_TABLET | Freq: Every day | ORAL | 1 refills | Status: AC

## 2017-11-10 MED ORDER — CLOPIDOGREL BISULFATE 75 MG PO TABS: 75.0000 mg | ORAL_TABLET | Freq: Every day | ORAL | 0 refills | Status: DC

## 2017-11-10 MED ORDER — ATORVASTATIN CALCIUM 40 MG PO TABS
40.0000 mg | ORAL_TABLET | Freq: Every day | ORAL | Status: DC
  Administered 2017-11-10: 40 mg via ORAL
  Filled 2017-11-10: qty 1

## 2017-11-10 NOTE — Progress Notes (Signed)
STROKE TEAM PROGRESS NOTE   HISTORY OF PRESENT ILLNESS (per record) Brendan Lin is an 84 y.o. male who is very healthy at baseline, no comorbidity and very little medical history. He tells me he basically eats vegetarian, does 20 sit ups/pushups and walks 68mi a day. mRS 0. Today he presented via POV to the emergency room for evaluation of sudden onset of right arm and face numbness and tingling. Patient states this started around 10 AM, he presented to the ER Triage about 1230 and Code Stroke was activated as he was emergently brought to CT. He was emergently evaluated in CT where symptoms were rapidly improving, NIHSS 0. Patient denies any trouble with his speech. He denies any weakness or facial droop. Denies any trouble with his balance or vision changes. There was no LVO and no tPA given due to resolution of symptoms.  Date last known well: 11/09/17 Time last known well: 10am tPA Given: no, resolution of symptoms   SUBJECTIVE (INTERVAL HISTORY) His family is not at bedside. Patient stopped his ASA at home. Discussed his stroke and reviewed images with patient. Thalamic small-vessel stroke.     OBJECTIVE Temp:  [97.8 F (36.6 C)-98.7 F (37.1 C)] 97.9 F (36.6 C) (05/26 0417) Pulse Rate:  [48-82] 56 (05/26 0417) Cardiac Rhythm: Heart block (05/26 0705) Resp:  [17-24] 18 (05/26 0417) BP: (95-165)/(56-94) 95/61 (05/26 0417) SpO2:  [97 %-100 %] 100 % (05/26 0417) Weight:  [161 lb 6 oz (73.2 kg)] 161 lb 6 oz (73.2 kg) (05/25 1950)  CBC:  Recent Labs  Lab 11/09/17 1230 11/09/17 1236  WBC 6.5  --   NEUTROABS 4.3  --   HGB 13.1 13.3  HCT 40.0 39.0  MCV 87.7  --   PLT 158  --     Basic Metabolic Panel:  Recent Labs  Lab 11/09/17 1230 11/09/17 1236 11/10/17 0422  NA 140 142 139  K 4.0 3.9 4.0  CL 109 107 109  CO2 25  --  22  GLUCOSE 107* 103* 93  BUN 10 11 10   CREATININE 0.93 0.80 0.84  CALCIUM 9.4  --  9.2    Lipid Panel:     Component Value Date/Time   CHOL  181 11/09/2017 1230   TRIG 136 11/09/2017 1230   HDL 43 11/09/2017 1230   CHOLHDL 4.2 11/09/2017 1230   VLDL 27 11/09/2017 1230   LDLCALC 111 (H) 11/09/2017 1230   HgbA1c:  Lab Results  Component Value Date   HGBA1C 5.6 11/09/2017   Urine Drug Screen: No results found for: LABOPIA, COCAINSCRNUR, LABBENZ, AMPHETMU, THCU, LABBARB  Alcohol Level     Component Value Date/Time   ETH <10 11/09/2017 1230    IMAGING   Ct Angio Head W Or Wo Contrast Ct Angio Neck W Or Wo Contrast 11/09/2017 IMPRESSION:  No intracranial or extracranial stenosis of significance. No dissection or large vessel occlusion.    Mr Brain Wo Contrast 11/09/2017 IMPRESSION:  1. Acute nonhemorrhagic linear infarct involving the left thalamus.  2. Mild atrophy and white matter changes are likely within normal limits for age, reflecting some degree of chronic microvascular ischemia.  3. Degenerative changes of the upper cervical spine with probable central canal narrowing at C3-4 and C4-5    Ct Head Code Stroke Wo Contrast 11/09/2017 IMPRESSION:  1. Query LEFT thalamus/LEFT posterior limb internal capsule acute infarct. Atrophy and small vessel disease.  2. ASPECTS is 9.     Transthoracic Echocardiogram - pending 00/00/00  PHYSICAL EXAM Vitals:   11/09/17 1950 11/09/17 2200 11/10/17 0001 11/10/17 0417  BP: (!) 165/83 106/61 106/62 95/61  Pulse: (!) 53 (!) 53 (!) 48 (!) 56  Resp: 20 20 20 18   Temp: 98.2 F (36.8 C) 98.4 F (36.9 C) 97.8 F (36.6 C) 97.9 F (36.6 C)  TempSrc: Oral Oral Oral Oral  SpO2: 97% 100% 98% 100%  Weight: 161 lb 6 oz (73.2 kg)     Height: 5\' 6"  (1.676 m)       General -  Heart - Regular rate and rhythm - no murmer appreciated Lungs - Clear to auscultation anteriorly Abdomen - Soft - non tender Extremities - Distal pulses intact - no edema Skin - Warm and dry  Mental Status: Alert, oriented, thought content appropriate.  Speech fluent without evidence of  aphasia.  Able to follow 3 step commands without difficulty. Cranial Nerves: II: Discs not visualized; Visual fields grossly normal, pupils equal, round, reactive to light. III,IV, VI: ptosis not present, extra-ocular motions intact bilaterally V,VII: smile symmetric, facial light touch sensation normal bilaterally VIII: hearing normal bilaterally IX,X: gag reflex present XI: bilateral shoulder shrug intact. XII: midline tongue extension Motor: RUE - 5/5    LUE - 5/5 RLE - 5/5    LLE -  5/5 Tone and bulk:normal tone throughout; no atrophy noted Sensory: Light touch intact throughout, bilaterally Deep Tendon Reflexes: 2+ and symmetric throughout Plantars: Right: downgoing   Left: downgoing Cerebellar: normal finger-to-nose, normal rapid alternating movements and normal heel-to-shin test Gait: not tested   ASSESSMENT/PLAN [name] is a 84 y.o. male with history of hypothyroidism and history of prostate cancer presenting with right-sided numbness and tingling. He did not receive IV t-PA due to resolution of deficits.  Stroke:  Left thalamic infarct secondary to small vessel disease.  Resultant  resolved  CT head - Query LEFT thalamus/LEFT posterior limb internal capsule acute infarct.  MRI head - Acute nonhemorrhagic linear infarct involving the left thalamus.   MRA head - not performed  CTA H&N - no large vessel abnormalities.  Carotid Doppler - CTA neck  2D Echo - pending  LDL - 111  HgbA1c - 5.6  VTE prophylaxis - Lovenox Diet Order           Diet regular Room service appropriate? Yes; Fluid consistency: Thin  Diet effective now          No antithrombotic prior to admission, now on aspirin 325 mg daily  Patient counseled to be compliant with his antithrombotic medications  Ongoing aggressive stroke risk factor management  Therapy recommendations:  pending  Disposition:  Pending  Hypertension  Blood pressure occasionally mildly  low . Permissive hypertension (OK if < 220/120) but gradually normalize in 5-7 days . Long-term BP goal normotensive  Hyperlipidemia  Lipid lowering medication PTA:  none  LDL 111, goal < 70  Current lipid lowering medication: now on Lipitor 40 mg daily  Continue statin at discharge    Other Stroke Risk Factors  Advanced age  Former cigarette smoker - quit    Other Active Problems  Prostate cancer   Plan / Recommendations   Stroke workup: Small vessel thalamic stroke  Therapy Follow Up: pending  Disposition: pending  Antiplatelet / Anticoagulation: discharge on Plavix 75mg  and ASA 81mg  for 3 weeks and then ASA alone  Statin: continue Lipitor 40 mg daily  MD Follow Up: [facility] in 6-8 weeks with Janett Billow  Other: pending  Further risk factor  modification per primary care MD: Follow Up 2 weeks   Hospital day # 0 Personally  participated in, made any corrections needed, and agree with history, physical, neuro exam,assessment and plan as stated above.     Sarina Ill, MD [facility]   To contact Stroke Continuity provider, please refer to [email or web address] After hours, contact General Neurology

## 2017-11-10 NOTE — Progress Notes (Signed)
*  PRELIMINARY RESULTS* Echocardiogram 2D Echocardiogram has been performed.  Leavy Cella 11/10/2017, 3:12 PM

## 2017-11-10 NOTE — Evaluation (Signed)
Physical Therapy Evaluation Patient Details Name: Brendan Lin MRN: [id number] DOB: 1932-01-30 Today's Date: 11/10/2017   History of Present Illness  Pt is a 84 yo male admitted through ED with onset of right arm and facial numbness. Was found to have an acute ischemia stroke likely lucunar. Symptoms resolved on admission. PMH significant for postrate CA, thryroid disease, tubular adenoma.   Clinical Impression  Pt presents with the above diagnosis and below deficits for therapy evaluation. Pt lives alone in a single level apartment and was completely independent with all ADLs and IADLs including driving, perform exercise everyday. Pt is able to perform gait without an AD with supervision progressing toward independent. Pt had some LOB with eyes closed during Romberg assessment, but is good otherwise with balance activities. Pt does not require any further acute PT follow-up, but will benefit from a HHPT consult upon returning home in order to ensure safety. Pt is in agreement to PT POC and to attempt HHPT at discharge.     Follow Up Recommendations Home health PT    Equipment Recommendations  3in1 (PT)    Recommendations for Other Services       Precautions / Restrictions Precautions Precautions: Fall Precaution Comments: can be impulsive Restrictions Weight Bearing Restrictions: No      Mobility  Bed Mobility Overal bed mobility: Independent             General bed mobility comments: able to get EOB and perform sit to stand without assistance or use of rails  Transfers Overall transfer level: Needs assistance Equipment used: None Transfers: Sit to/from Stand Sit to Stand: Supervision;Min guard         General transfer comment: Min guard initially from EOB and supervision to Mod I from recliner and remainder of visit  Ambulation/Gait Ambulation/Gait assistance: Supervision;Min guard Ambulation Distance (Feet): 150 Feet Assistive device: None Gait  Pattern/deviations: Step-through pattern Gait velocity: WFL Gait velocity interpretation: >2.62 ft/sec, indicative of community ambulatory General Gait Details: good seqeuncing and step length, increase gait speed. Some instability with initiation of gait and then WNL throughout remainder of gait.   Stairs            Wheelchair Mobility    Modified Rankin (Stroke Patients Only) Modified Rankin (Stroke Patients Only) Pre-Morbid Rankin Score: No symptoms Modified Rankin: No symptoms     Balance Overall balance assessment: Needs assistance Sitting-balance support: No upper extremity supported Sitting balance-Leahy Scale: Normal     Standing balance support: No upper extremity supported Standing balance-Leahy Scale: Normal             Rhomberg - Eyes Closed: 15 High level balance activites: Turns High Level Balance Comments: sba for safety with turns Standardized Balance Assessment Standardized Balance Assessment : TUG: Timed Up and Go Test     Timed Up and Go Test TUG: Normal TUG Normal TUG (seconds): 12     Pertinent Vitals/Pain Pain Assessment: No/denies pain    Home Living Family/patient expects to be discharged to:: Private residence Living Arrangements: Alone Available Help at Discharge: Family;Friend(s);Available PRN/intermittently Type of Home: Apartment Home Access: Level entry     Home Layout: One level Home Equipment: Cane - single point      Prior Function Level of Independence: Independent               Hand Dominance   Dominant Hand: Right    Extremity/Trunk Assessment   Upper Extremity Assessment Upper Extremity Assessment: Defer to OT evaluation  Lower Extremity Assessment Lower Extremity Assessment: Overall WFL for tasks assessed    Cervical / Trunk Assessment Cervical / Trunk Assessment: Normal  Communication   Communication: No difficulties  Cognition Arousal/Alertness: Awake/alert Behavior During Therapy: WFL  for tasks assessed/performed Overall Cognitive Status: Within Functional Limits for tasks assessed                                        General Comments      Exercises     Assessment/Plan    PT Assessment All further PT needs can be met in the next venue of care  PT Problem List Decreased balance;Decreased mobility;Decreased safety awareness       PT Treatment Interventions      PT Goals (Current goals can be found in the Care Plan section)  Acute Rehab PT Goals Patient Stated Goal: to get back to normal including playing the piano PT Goal Formulation: With patient    Frequency     Barriers to discharge        Co-evaluation               AM-PAC PT "6 Clicks" Daily Activity  Outcome Measure Difficulty turning over in bed (including adjusting bedclothes, sheets and blankets)?: None Difficulty moving from lying on back to sitting on the side of the bed? : None Difficulty sitting down on and standing up from a chair with arms (e.g., wheelchair, bedside commode, etc,.)?: None Help needed moving to and from a bed to chair (including a wheelchair)?: None Help needed walking in hospital room?: None Help needed climbing 3-5 steps with a railing? : None 6 Click Score: 24    End of Session Equipment Utilized During Treatment: Gait belt Activity Tolerance: Patient tolerated treatment well Patient left: in chair;with call bell/phone within reach Nurse Communication: Mobility status PT Visit Diagnosis: Unsteadiness on feet (R26.81)    Time: 6073-7106 PT Time Calculation (min) (ACUTE ONLY): 24 min   Charges:   PT Evaluation $PT Eval Moderate Complexity: 1 Mod PT Treatments $Gait Training: 8-22 mins   PT G Codes:   PT G-Codes **NOT FOR INPATIENT CLASS** Functional Assessment Tool Used: AM-PAC 6 Clicks Basic Mobility;Clinical judgement Functional Limitation: Mobility: Walking and moving around Mobility: Walking and Moving Around Current Status  (Y6948): 0 percent impaired, limited or restricted Mobility: Walking and Moving Around Goal Status (N4627): 0 percent impaired, limited or restricted Mobility: Walking and Moving Around Discharge Status (O3500): 0 percent impaired, limited or restricted    Scheryl Marten PT, DPT     Jacqulyn Liner Sloan Leiter 11/10/2017, 9:28 AM

## 2017-11-10 NOTE — Evaluation (Signed)
Occupational Therapy Evaluation Patient Details Name: Brendan Lin MRN: [id number] DOB: 1932/02/11 Today's Date: 11/10/2017    History of Present Illness Pt is a 84 yo male admitted through ED with onset of right arm and facial numbness. Was found to have an acute ischemia stroke likely lucunar. Symptoms resolved on admission. PMH significant for postrate CA, thryroid disease, tubular adenoma.    Clinical Impression   PTA, pt was independent with ADL and functional mobility. Pt currently presents to OT completing standing ADL and ADL transfers with modified independence today. He does demonstrate impulsivity and tingling in R hand with some lag during coordination testing. However, pt is able to complete fine motor tasks to manipulate a variety of containers without assistance today. Educated pt concerning safety with tingling and decreased sensation in R hand including use of visual feedback to protect during self-care to prevent injury. He demonstrates understanding. Pt does not need further acute OT follow up but would benefit from home health OT follow-up to maximize safety in his home environment. Recommend 3-in-1 BSC for DME needs to use in shower.     Follow Up Recommendations  Home health OT    Equipment Recommendations  3 in 1 bedside commode    Recommendations for Other Services       Precautions / Restrictions Precautions Precautions: Fall Precaution Comments: can be impulsive Restrictions Weight Bearing Restrictions: No      Mobility Bed Mobility Overal bed mobility: Independent             General bed mobility comments: able to get EOB and perform sit to stand without assistance or use of rails  Transfers Overall transfer level: Modified independent Equipment used: None Transfers: Sit to/from Stand Sit to Stand: Modified independent (Device/Increase time)         General transfer comment: Min guard initially from EOB and supervision to Mod I from  recliner and remainder of visit    Balance Overall balance assessment: Needs assistance Sitting-balance support: No upper extremity supported Sitting balance-Leahy Scale: Normal     Standing balance support: No upper extremity supported Standing balance-Leahy Scale: Normal             Rhomberg - Eyes Closed: 15 High level balance activites: Turns High Level Balance Comments: sba for safety with turns Standardized Balance Assessment Standardized Balance Assessment : TUG: Timed Up and Go Test     Timed Up and Go Test TUG: Normal TUG Normal TUG (seconds): 12   ADL either performed or assessed with clinical judgement   ADL Overall ADL's : Modified independent                                       General ADL Comments: Able to complete ADL tasks in hospital room setting with modified independence. Educated concerning compensatory strategies for diminished sensation in RUE.      Vision Baseline Vision/History: Wears glasses Wears Glasses: At all times Patient Visual Report: No change from baseline Vision Assessment?: No apparent visual deficits     Perception     Praxis      Pertinent Vitals/Pain Pain Assessment: No/denies pain     Hand Dominance Right   Extremity/Trunk Assessment Upper Extremity Assessment Upper Extremity Assessment: RUE deficits/detail RUE Deficits / Details: Tingling in R hand with slightly diminished fine motor coordination. Able to utilize for functional tasks.  RUE Sensation: decreased light touch RUE Coordination:  decreased fine motor   Lower Extremity Assessment Lower Extremity Assessment: Overall WFL for tasks assessed   Cervical / Trunk Assessment Cervical / Trunk Assessment: Normal   Communication Communication Communication: No difficulties   Cognition Arousal/Alertness: Awake/alert Behavior During Therapy: Impulsive Overall Cognitive Status: No family/caregiver present to determine baseline cognitive  functioning Area of Impairment: Memory;Safety/judgement                     Memory: Decreased short-term memory   Safety/Judgement: Decreased awareness of safety     General Comments: Pt with noted difficulty maintaining safety and tends to rush through tasks.    General Comments       Exercises     Shoulder Instructions      Home Living Family/patient expects to be discharged to:: Private residence Living Arrangements: Alone Available Help at Discharge: Family;Friend(s);Available PRN/intermittently Type of Home: Apartment Home Access: Level entry     Home Layout: One level     Bathroom Shower/Tub: Teacher, early years/pre: Standard     Home Equipment: Cane - single point          Prior Functioning/Environment Level of Independence: Independent                 OT Problem List: Decreased range of motion;Decreased activity tolerance;Impaired balance (sitting and/or standing);Impaired sensation;Decreased safety awareness      OT Treatment/Interventions:      OT Goals(Current goals can be found in the care plan section) Acute Rehab OT Goals Patient Stated Goal: to get back to normal including playing the piano OT Goal Formulation: With patient  OT Frequency:     Barriers to D/C:            Co-evaluation              AM-PAC PT "6 Clicks" Daily Activity     Outcome Measure Help from another person eating meals?: None Help from another person taking care of personal grooming?: None Help from another person toileting, which includes using toliet, bedpan, or urinal?: None Help from another person bathing (including washing, rinsing, drying)?: None Help from another person to put on and taking off regular upper body clothing?: None Help from another person to put on and taking off regular lower body clothing?: None 6 Click Score: 24   End of Session Nurse Communication: Mobility status  Activity Tolerance: Patient tolerated  treatment well Patient left: in bed;with call bell/phone within reach;with bed alarm set  OT Visit Diagnosis: Other abnormalities of gait and mobility (R26.89)                Time: 2202-5427 OT Time Calculation (min): 14 min Charges:  OT General Charges $OT Visit: 1 Visit OT Evaluation $OT Eval Moderate Complexity: 1 Mod G-Codes:     Norman Herrlich, MS OTR/L  Pager: [place] A Akeel Reffner 11/10/2017, 10:40 AM

## 2017-11-10 NOTE — Progress Notes (Signed)
Patient ready for discharge to home; discharge instructions given and reviewed; Rx's sent electronically; patient discharged out via wheelchair accompanied by a friend.

## 2017-11-10 NOTE — Discharge Instructions (Addendum)
IT was a pleasure to take care of you Brendan Lin. You were taken care of for a stroke.  Please take aspirn 81mg  and plavix 75mg  daily for 3 weeks and then take only plavix after that    Lacunar Stroke A stroke is the sudden death of brain tissue that occurs when an area of the brain does not get enough oxygen. A lacunar stroke (lacunar infarction) is caused by a blockage in one of the small arteries deep in the brain. Lacunar stroke is a medical emergency that must be treated right away.It is important to get help right away as soon as you notice symptoms of stroke. What are the causes? A lacunar stroke occurs when small arteries deep in the brain become more narrow due to a buildup of fatty deposits in the arteries (atherosclerosis). When the arteries narrow, less blood flows to certain areas of the brain. Without enough blood and oxygen, these parts of the brain can die or become permanently damaged. What increases the risk? You are more likely to develop this condition if:  You have high blood pressure (hypertension).  You have high cholesterol (hyperlipidemia).  You smoke.  You have diabetes.  You are obese.  You have an unhealthy diet. This includes foods that are high in saturated fat, trans fat, and salt (sodium).  You have a heart rhythm disorder (atrial fibrillation).  You have heart disease.  You have artery disease, such as carotid artery disease or peripheral artery disease.  You have sickle cell disease.  You are age 12 or older.  You have a personal or family history of stroke.  You are African-American.  You are a woman who: ? Is pregnant. ? Has a history of diabetes during pregnancy (gestational diabetes). ? Has a history of preeclampsia or eclampsia. ? Has had hormone therapy after menopause. ? Uses oral birth control (contraception), especially while smoking.  What are the signs or symptoms? Symptoms of this condition usually develop suddenly. They may  include:  Weakness or numbness in your face, arm, or leg, especially on one side of your body.  Trouble walking or difficulty moving your arms or legs.  Loss of balance or coordination.  Confusion.  Slurred speech (dysarthria).  Trouble speaking, understanding speech, or both (aphasia).  Vision problems in one or both eyes.  Dizziness.  Nausea and vomiting.  Severe headache with no known cause.  How is this diagnosed? This condition may be diagnosed based on:  Your symptoms and medical history.  A physical exam.  Blood tests.  A CT scan of the brain.  MRI.  Ultrasound of an artery. This may help find blood flow problems or blockages.  Angiogram. During this test, dye is injected into your blood and then an X-ray is done to look for blockages. The dye helps blood flow and blockages show up clearly on X-rays.  Electroencephalogram (EEG). This test checks electrical activity in the brain.  How is this treated? This condition must be treated within 4.5 hours of the start of the stroke. It is treated with IV medicine that dissolves the blood clot (tissue plasminogen activator, TPA) that is causing the blockage. The goal is to restore blood flow to the brain as soon as possible. Your health care provider may prescribe blood thinners (antiplatelets or anticoagulants) to lower your risk of another stroke. Follow these instructions at home: Medicines  Take over-the-counter and other prescription medicines only as told by your health care provider. This includes diabetes or cholesterol  medicine.  If you were told to take a medicine to thin your blood, such as aspirin, take it exactly as told by your health care provider. ? Taking too much blood-thinning medicine can cause bleeding. ? If you do not take enough blood-thinning medicine, you will not have the protection that you need against another stroke and other problems. Eating and drinking  Follow instructions from your  health care provider about eating and drinking restrictions.  Eat a healthy diet. This includes plenty of fruits and vegetables, lean meats, whole grains, and low-fat dairy products.  Avoid foods high in saturated fat, trans fat, or sodium.  Limit alcohol intake to no more than 1 drink a day for nonpregnant women and 2 drinks a day for men. One drink equals 12 oz of beer, 5 oz of wine, or 1 oz of hard liquor. Safety  If you need help walking, use a cane or walker as told by your health care provider.  Take steps to lower the risk of falls in your home. This may include: ? Using safety equipment, such as raised toilets and a seat in the shower. ? Removing clutter and tripping hazards from walkways, such as cords or rugs. ? Installing grab bars in the bedroom and bathroom. Activity  Exercise regularly, as told by your health care provider.  Take part in rehabilitation programs as told by your health care provider. This may include physical therapy, occupational therapy, or speech therapy. General instructions  Do not use any tobacco products, such as cigarettes, chewing tobacco, and e-cigarettes. If you need help quitting, ask your health care provider.  Keep all follow-up visits as told by your health care provider. This is important. Get help right away if:  You have any symptoms of stroke. BE FAST is an easy way to remember the main warning signs of stroke: ? B - Balance. Signs are dizziness, sudden trouble walking, or loss of balance. ? E - Eyes. Signs are trouble seeing or a sudden change in vision. ? F - Face. Signs are sudden weakness or numbness of the face, or the face or eyelid drooping on one side. ? A - Arms. Signs are weakness or numbness in an arm. This happens suddenly and usually on one side of the body. ? S - Speech. Signs are sudden trouble speaking, slurred speech, or trouble understanding what people say. ? T - Time. Time to call emergency services. Write down  what time symptoms started.  You have other signs of stroke, such as: ? A sudden, severe headache with no known cause. ? Nausea or vomiting. ? Seizure.  You have a severe fall or injury. These symptoms may represent a serious problem that is an emergency. Do not wait to see if the symptoms will go away. Get medical help right away. Call your local emergency services (911 in the [place] Do not drive yourself to the hospital. Summary  A lacunar stroke (lacunar infarction) is a blockage of one of the small arteries deep in the brain. When one of these arteries is blocked, parts of the brain do not get enough oxygen and may die.  This condition is a medical emergency that must be treated right away. Treatments must be done within 4.5 hours of the start of the stroke.  Controlling your risk factors for stroke is the best way to avoid another lacunar stroke.  Get help right away if you have any symptoms of stroke. "BE FAST is an easy way to remember  the main warning signs of stroke. This information is not intended to replace advice given to you by your health care provider. Make sure you discuss any questions you have with your health care provider. Document Released: 10/19/2016 Document Revised: 10/19/2016 Document Reviewed: 10/19/2016 Elsevier Interactive Patient Education  2018 [employer].

## 2017-11-10 NOTE — Evaluation (Signed)
Speech Language Pathology Evaluation Patient Details Name: Brendan Lin MRN: [id number] DOB: 04-May-1932 Today's Date: 11/10/2017 Time: 2229-7989 SLP Time Calculation (min) (ACUTE ONLY): 30 min  Problem List:  Patient Active Problem List   Diagnosis Date Noted  . CVA (cerebral vascular accident) ([place]) 11/09/2017  . Malignant neoplasm of prostate ([place]) 03/23/2016   Past Medical History:  Past Medical History:  Diagnosis Date  . Prostate cancer ([place])   . Thyroid disease   . Tubular adenoma of colon    found during colonoscopy 2016   Past Surgical History:  Past Surgical History:  Procedure Laterality Date  . COLONOSCOPY    . INGUINAL HERNIA REPAIR Left   . PROSTATE BIOPSY     HPI:  84 yo male adm to [facility] with right arm and facial numbness. Was found to have an acute ischemia stroke likely lucunar. Symptoms resolved on admission. PMH significant for postrate CA, thryroid disease, tubular adenoma.    Assessment / Plan / Recommendation Clinical Impression  MOCA 7.1 administered with pt scoring 25/30 indicating minimal cogntive impairment.  Pt recalled 3/5 words independently, 2/5 with multiple choice.  Strengths included in language with pt demonstrating excellent fluency.  No dysarthria/aphasia noted. Difficulty with visuospatial skills noted but not significant.  Pt does not require SLP follow up as he is at his baseline.  Thanks.       SLP Assessment  SLP Recommendation/Assessment: Patient does not need any further Speech Lanaguage Pathology Services SLP Visit Diagnosis: Cognitive communication deficit (R41.841)    Follow Up Recommendations  None    Frequency and Duration     n/a      SLP Evaluation Cognition  Overall Cognitive Status: Within Functional Limits for tasks assessed Arousal/Alertness: Awake/alert Orientation Level: Oriented X4 Attention: Sustained Sustained Attention: Appears intact Memory: Impaired Memory Impairment: Retrieval deficit(3/5 independently,  2/5 with multiple choice) Awareness: Appears intact Problem Solving: Appears intact Safety/Judgment: Appears intact       Comprehension  Auditory Comprehension Overall Auditory Comprehension: Appears within functional limits for tasks assessed Yes/No Questions: Not tested Commands: Not tested Conversation: Complex Visual Recognition/Discrimination Discrimination: Within Function Limits Reading Comprehension Reading Status: Within funtional limits(for tasks evaluated) Effective Techniques: Eye glasses    Expression Expression Primary Mode of Expression: Verbal Verbal Expression Overall Verbal Expression: Appears within functional limits for tasks assessed Initiation: No impairment Repetition: No impairment Naming: No impairment Pragmatics: No impairment Written Expression Dominant Hand: Right   Oral / Motor  Oral Motor/Sensory Function Overall Oral Motor/Sensory Function: Within functional limits Motor Speech Overall Motor Speech: Appears within functional limits for tasks assessed Respiration: Within functional limits Resonance: Within functional limits Articulation: Within functional limitis Intelligibility: Intelligible Motor Planning: Witnin functional limits Motor Speech Errors: Not applicable   GO                    [name] 11/10/2017, 12:47 PM  Luanna Salk, [facility] SLP [phone]

## 2017-11-10 NOTE — Progress Notes (Signed)
   Subjective:  Mr. Cowin was seen sitting up in his bed this morning doing well. He states that he still has a tingling sensation in his right hand but does not have any weakness or does not have any decrease grip strength in that hand. He has been able to move around in room without any difficulty.   Objective:  Vital signs in last 24 hours: Vitals:   11/09/17 1950 11/09/17 2200 11/10/17 0001 11/10/17 0417  BP: (!) 165/83 106/61 106/62 95/61  Pulse: (!) 53 (!) 53 (!) 48 (!) 56  Resp: 20 20 20 18   Temp: 98.2 F (36.8 C) 98.4 F (36.9 C) 97.8 F (36.6 C) 97.9 F (36.6 C)  TempSrc: Oral Oral Oral Oral  SpO2: 97% 100% 98% 100%  Weight: 161 lb 6 oz (73.2 kg)     Height: 5\' 6"  (1.676 m)      Physical Exam  Constitutional: He appears well-developed and well-nourished. No distress.  HENT:  Head: Normocephalic.  Eyes: Conjunctivae are normal.  Cardiovascular: Normal rate, regular rhythm and normal heart sounds.  Respiratory: Breath sounds normal. No respiratory distress. He has no wheezes.  GI: Soft. Bowel sounds are normal. He exhibits no distension. There is no tenderness.  Musculoskeletal: He exhibits no edema.  5/5 bilateral upper extremity strength  Neurological: He is alert. No cranial nerve deficit.  Skin: He is not diaphoretic. No erythema.  Psychiatric: He has a normal mood and affect. His behavior is normal. Judgment and thought content normal.   Assessment/Plan:  Mr. Burmester is a 84 y.o m with history of prostate cancer and thyroid disease who presented a few hours after he noted right facial and upper extremity numbness. Ct head and MRI brain showed infarcts in the left thalamic and left internal capsular regions.  Left thalamic and left internal capsular lacunar stroke MRI brain also showed an acute nonhemorrhagic linear infarct in the left thalamus along with mild atrophy and white matter changes. CTA head and neck did not show intra or extracranial stenosis.  TSH=1.379. HbA1C=5.6. Lipid panel with ldl=111, tcholes=181, hdl=43. ASCVD could not be collected due to age being out of range. Patient's stroke is likely lacunar stroke.  -TTE pending  -Started patient on atorvastatin 40mg  qhs -Aspirin and plavix 75mg  with plavix load of 300mg  -PT recommends home health -Appreciate neurology following  Adenocarcinoma of prostate [id number] The patient has a gleason's score of 3+4. Most recent radiation was done from 04/10/16-06/05/16. The patient denies any dysuria.   Dispo: Anticipated discharge in approximately 0-1 day(s).   Lars Mage, MD 11/10/2017, 9:48 AM Pager: [phone]

## 2017-11-11 NOTE — Care Management Note (Signed)
Case Management Note  Patient Details  Name: Brendan Lin MRN: [id number] Date of Birth: [birth year]  Subjective/Objective:                    Action/Plan: Pt discharged late yesterday with orders for [facility] services. CM called the patient x 2 and was able to provide him choice of [facility] agencies. He selected [facility]. Butch Penny with [facility] notified and accepted the referral.   Expected Discharge Date:  11/10/17               Expected Discharge Plan:  [place]  In-House Referral:     Discharge planning Services  CM Consult  Post Acute Care Choice:  Home Health Choice offered to:  Patient  DME Arranged:    DME Agency:     HH Arranged:  PT, OT HH Agency:  [place]  Status of Service:  Completed, signed off  If discussed at [place] of Stay Meetings, dates discussed:    Additional Comments:  Pollie Friar, RN 11/11/2017, 3:49 PM

## 2017-12-26 ENCOUNTER — Encounter: Payer: Self-pay | Admitting: Adult Health

## 2017-12-26 ENCOUNTER — Ambulatory Visit (INDEPENDENT_AMBULATORY_CARE_PROVIDER_SITE_OTHER): Payer: Medicare Other | Admitting: Adult Health

## 2017-12-26 VITALS — BP 134/79 | HR 59 | Ht 66.0 in | Wt 159.5 lb

## 2017-12-26 DIAGNOSIS — I6381 Other cerebral infarction due to occlusion or stenosis of small artery: Secondary | ICD-10-CM

## 2017-12-26 DIAGNOSIS — I639 Cerebral infarction, unspecified: Secondary | ICD-10-CM | POA: Diagnosis not present

## 2017-12-26 DIAGNOSIS — I10 Essential (primary) hypertension: Secondary | ICD-10-CM | POA: Diagnosis not present

## 2017-12-26 DIAGNOSIS — E785 Hyperlipidemia, unspecified: Secondary | ICD-10-CM

## 2017-12-26 MED ORDER — ASPIRIN EC 81 MG PO TBEC: 81.0000 mg | DELAYED_RELEASE_TABLET | Freq: Every day | ORAL | Status: AC

## 2017-12-26 NOTE — Progress Notes (Signed)
I agree with the above plan 

## 2017-12-26 NOTE — Patient Instructions (Addendum)
Continue aspirin 81 mg daily  and lipitor  for secondary stroke prevention  Stop plavix as you have completed 3 weeks of blood thinner therapy  Continue to follow up with PCP regarding cholesterol and blood pressure management   Continue to monitor blood pressure at home  Maintain strict control of hypertension with blood pressure goal below 130/90, diabetes with hemoglobin A1c goal below 6.5% and cholesterol with LDL cholesterol (bad cholesterol) goal below 70 mg/dL. I also advised the patient to eat a healthy diet with plenty of whole grains, cereals, fruits and vegetables, exercise regularly and maintain ideal body weight.  Followup in the future with me in 3 months or call earlier if needed         Thank you for coming to see [place] at [facility]. I hope we have been able to provide you high quality care today.  You may receive a patient satisfaction survey over the next few weeks. We would appreciate your feedback and comments so that we may continue to improve ourselves and the health of our patients.

## 2017-12-26 NOTE — Progress Notes (Signed)
[facility] [address] [place]. [place] [address] [phone]       OFFICE FOLLOW UP NOTE  [name] Date of Birth:  1932-03-16 Medical Record Number:  [id number]   Reason for Referral:  hospital stroke follow up  CHIEF COMPLAINT:  Chief Complaint  Patient presents with  . Follow-up    Hospital Stroke follow up ,Pt saw Dr. Jaynee Lin in hospital  patient is at home, he is alone    HPI: Brendan Lin is being seen today for initial visit in the office for left thalamic infarct on 11/09/17. History obtained from patient and chart review. Reviewed all radiology images and labs personally.  Brendan Lin an 84 Lin is very healthy at baseline, no comorbidity and very little medical history. He tells me he basically eats vegetarian, does 20 sit ups/pushups and walks 42mi a day. mRS 0. Today he presented via POVto the emergency room for evaluation of sudden onset of right arm and facenumbness and tingling. Patient states this startedaround 10 AM, he presented to the ER Triage about 1230 and Code Stroke was activated as he was emergently brought to CT which showed left thalamus/left posterior limb internal capsule acute infarct.Marland Kitchen He was emergently evaluated in CT where symptoms were rapidly improving, NIHSS 0.Patient denies any trouble with his speech. He denies any weaknessor facial droop. Denies any trouble with his balanceor vision changes.There was no LVO and no tPA given due to resolution of symptoms.  MRI head reviewed and showed acute nonhemorrhagic linear infarct involving the left thalamus.  CT head and neck was negative for large vessel abnormalities.  2D echo showed EF of 60 to 65% without evidence of PFO.  LDL 111 and as patient was not on statin PTA is recommended Lipitor 40 mg daily.  A1c satisfactory at 5.6.  His patient was on antithrombotic PTA was recommended DAPT for 3 weeks and then aspirin alone.  Patient was discharged in stable  condition.  Patient seen today for hospital follow-up.  Overall is doing well with most of his tingling and numbness resolved but does still complain of mild tingling in his right arm.  Denies nerve pain.  He continues to take aspirin and Plavix without side effects of bleeding or bruising.  Continues to take Lipitor without side effects myalgias.  He continues to keep active and return to all previous activities along with eating a healthy diet.  Blood pressure today satisfactory 134/79.  Denies new or worsening stroke/TIA symptoms.    ROS:   14 system review of systems performed and negative with exception of numbness  PMH:  Past Medical History:  Diagnosis Date  . Prostate cancer ([place])   . Stroke ([place])   . Thyroid disease   . Tubular adenoma of colon    found during colonoscopy 2016    PSH:  Past Surgical History:  Procedure Laterality Date  . COLONOSCOPY    . INGUINAL HERNIA REPAIR Left   . PROSTATE BIOPSY      Social History:  Social History   Socioeconomic History  . Marital status: Single    Spouse name: Not on file  . Number of children: Not on file  . Years of education: Not on file  . Highest education level: Not on file  Occupational History  . Occupation: Retired    Comment: Research scientist (physical sciences)  . Financial resource strain: Not on file  . Food insecurity:    Worry: Not on file  Inability: Not on file  . Transportation needs:    Medical: Not on file    Non-medical: Not on file  Tobacco Use  . Smoking status: Former Smoker    Packs/day: 0.00    Years: 8.00    Pack years: 0.00    Types: Pipe  . Smokeless tobacco: Never Used  Substance and Sexual Activity  . Alcohol use: No    Alcohol/week: 0.0 oz  . Drug use: No  . Sexual activity: Not on file  Lifestyle  . Physical activity:    Days per week: Not on file    Minutes per session: Not on file  . Stress: Not on file  Relationships  . Social connections:    Talks on phone: Not on file      Gets together: Not on file    Attends religious service: Not on file    Active member of club or organization: Not on file    Attends meetings of clubs or organizations: Not on file    Relationship status: Not on file  . Intimate partner violence:    Fear of current or ex partner: Not on file    Emotionally abused: Not on file    Physically abused: Not on file    Forced sexual activity: Not on file  Other Topics Concern  . Not on file  Social History Narrative  . Not on file    Family History: History reviewed. No pertinent family history.  Medications:   Current Outpatient Medications on File Prior to Visit  Medication Sig Dispense Refill  . brimonidine (ALPHAGAN) 0.2 % ophthalmic solution Place 1 drop into both eyes 2 (two) times daily.    . dorzolamide-timolol (COSOPT) 22.3-6.8 MG/ML ophthalmic solution Place 1 drop into both eyes 2 (two) times daily.    Marland Kitchen latanoprost (XALATAN) 0.005 % ophthalmic solution Place 1 drop into both eyes at bedtime.    Marland Kitchen levothyroxine (SYNTHROID, LEVOTHROID) 75 MCG tablet Take 75 mcg by mouth daily before breakfast.    . atorvastatin (LIPITOR) 40 MG tablet Take 1 tablet (40 mg total) by mouth daily at 6 PM. 30 tablet 1   No current facility-administered medications on file prior to visit.     Allergies:   Allergies  Allergen Reactions  . Sulfa Antibiotics Rash     Physical Exam  Vitals:   12/26/17 1430  BP: 134/79  Pulse: (!) 59  Weight: 159 lb 8 oz (72.3 kg)  Height: 5\' 6"  (1.676 m)   Body mass index is 25.74 kg/m. No exam data present  General: well developed, well nourished, pleasant elderly African-American male, seated, in no evident distress Head: head normocephalic and atraumatic.   Neck: supple with no carotid or supraclavicular bruits Cardiovascular: regular rate and rhythm, no murmurs Musculoskeletal: no deformity Skin:  no rash/petichiae Vascular:  Normal pulses all extremities  Neurologic Exam Mental Status:  Awake and fully alert. Oriented to place and time. Recent and remote memory intact. Attention span, concentration and fund of knowledge appropriate. Mood and affect appropriate.  Cranial Nerves: Fundoscopic exam reveals sharp disc margins. Pupils equal, briskly reactive to light. Extraocular movements full without nystagmus. Visual fields full to confrontation. Hearing intact. Facial sensation intact. Face, tongue, palate moves normally and symmetrically.  Motor: Normal bulk and tone. Normal strength in all tested extremity muscles. Sensory.: intact to touch , pinprick , position and vibratory sensation.  Coordination: Rapid alternating movements normal in all extremities. Finger-to-nose and heel-to-shin performed accurately bilaterally. Gait and  Station: Arises from chair without difficulty. Stance is normal. Gait demonstrates normal stride length and balance . Able to heel, toe and tandem walk without difficulty.  Reflexes: 1+ and symmetric. Toes downgoing.    NIHSS  0 Modified Rankin  1   Diagnostic Data (Labs, Imaging, Testing)  Ct Angio Head W Or Wo Contrast Ct Angio Neck W Or Wo Contrast 11/09/2017 IMPRESSION:  No intracranial or extracranial stenosis of significance. No dissection or large vessel occlusion.    Mr Brain Wo Contrast 11/09/2017 IMPRESSION:  1. Acute nonhemorrhagic linear infarct involving the left thalamus.  2. Mild atrophy and white matter changes are likely within normal limits for age, reflecting some degree of chronic microvascular ischemia.  3. Degenerative changes of the upper cervical spine with probable central canal narrowing at C3-4 and C4-5    Ct Head Code Stroke Wo Contrast 11/09/2017 IMPRESSION:  1. Query LEFT thalamus/LEFT posterior limb internal capsule acute infarct. Atrophy and small vessel disease.  2. ASPECTS is 9.     ASSESSMENT: Brendan Lin is a 84 y.o. year old male here with left thalamic infarct on 11/09/2017 secondary to small  vessel disease. Vascular risk factors include HTN and HLD.     PLAN: -Continue aspirin 81 mg daily  and lipitor  for secondary stroke prevention -Stop Plavix as 3-week of DAPT completed -continue on aspirin as patient was not on antithrombotic prior to stroke -F/u with PCP regarding your HLD and HTN management -continue to monitor BP at home -Continue to stay active and eat a healthy diet -Maintain strict control of hypertension with blood pressure goal below 130/90, diabetes with hemoglobin A1c goal below 6.5% and cholesterol with LDL cholesterol (bad cholesterol) goal below 70 mg/dL. I also advised the patient to eat a healthy diet with plenty of whole grains, cereals, fruits and vegetables, exercise regularly and maintain ideal body weight.  Follow up in 3 months or call earlier if needed   Greater than 50% of time during this 25 minute visit was spent on counseling,explanation of diagnosis of left thalamic infarct, reviewing risk factor management of HTN and HLD, planning of further management, discussion with patient and family and coordination of care    Venancio Poisson, [facility]  [facility] [address] [place], [place] [address]  Phone [phone] Fax [phone]

## 2018-04-01 ENCOUNTER — Ambulatory Visit: Payer: Medicare Other | Admitting: Adult Health

## 2020-01-29 IMAGING — CT CT HEAD CODE STROKE
3 series · 15 of 47 positions shown, 18 images · non-contrast
Comparison: None.

CLINICAL DATA: Code stroke. RIGHT-sided facial and arm numbness.
Last seen normal 2 hours ago.

EXAM:
CT HEAD WITHOUT CONTRAST
TECHNIQUE: Contiguous axial images were obtained from the base of the skull
through the vertex without intravenous contrast.

[Series 3: head 5.0 st · axial · 0.45mm/px · z∈[+1376,+1506]mm · 9 of 32 slices shown, 12 images]
[im 3/32  brain]
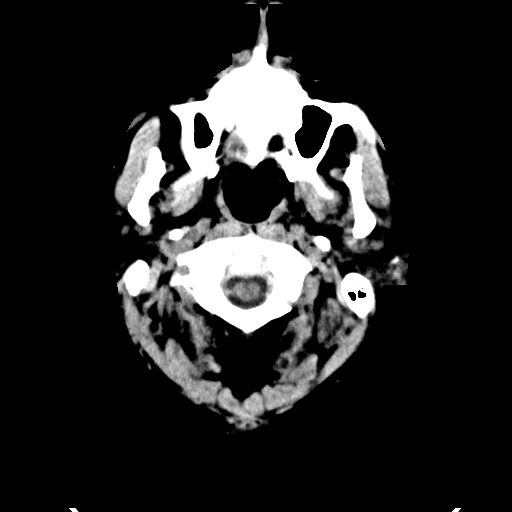
[im 3/32  bone]
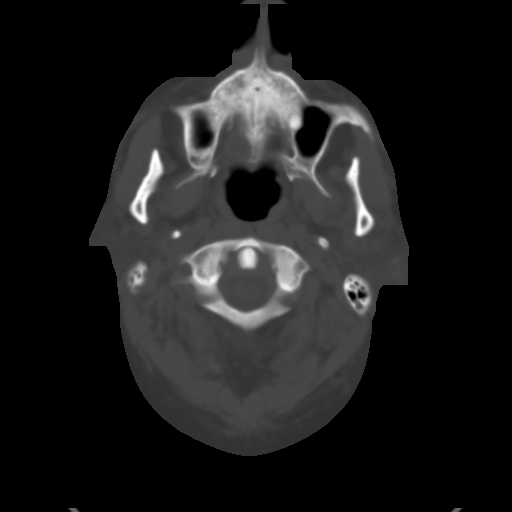
[im 6/32  brain]
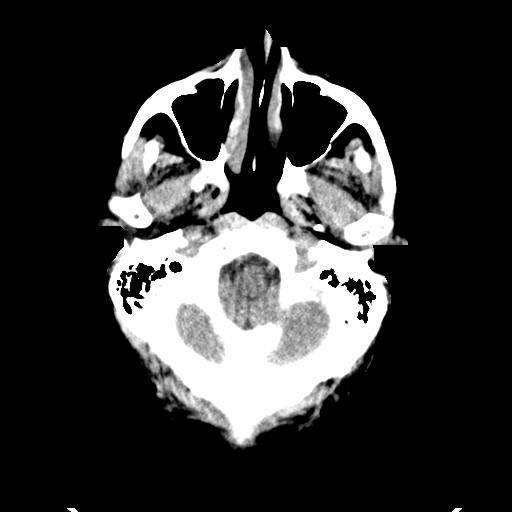
[im 9/32  brain]
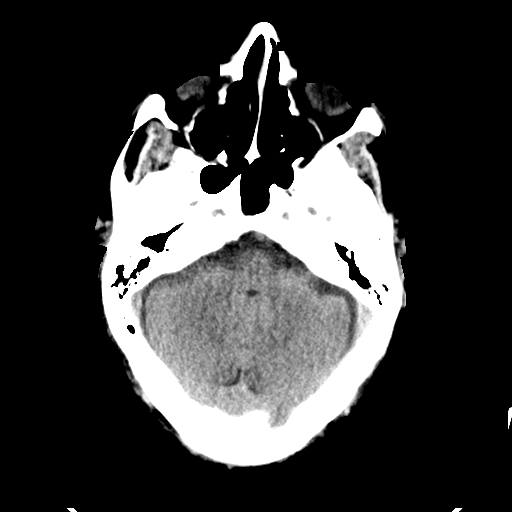
[im 12/32  brain]
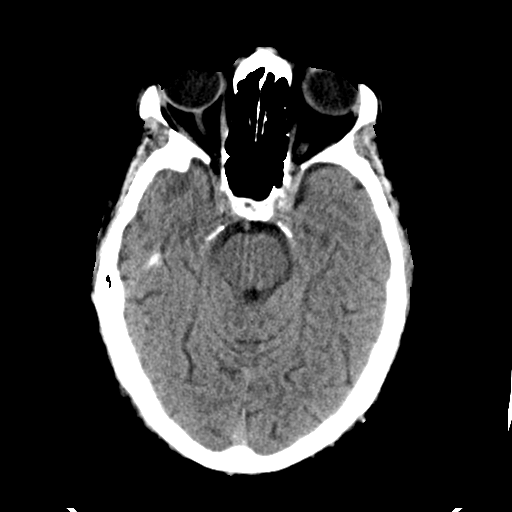
[im 17/32  brain]
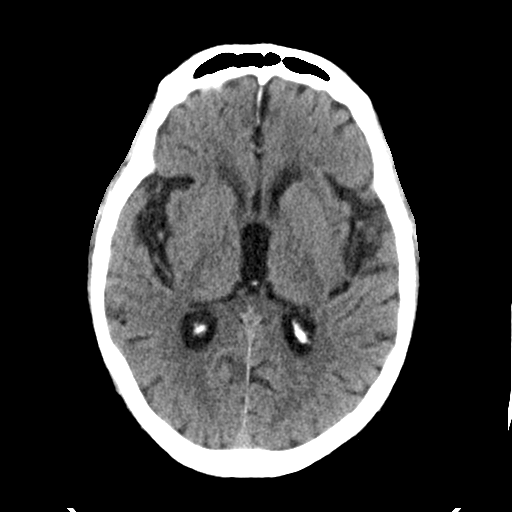
[im 17/32  bone]
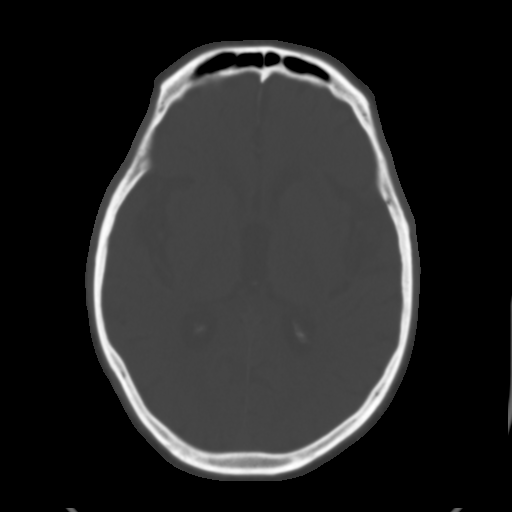
[im 20/32  brain]
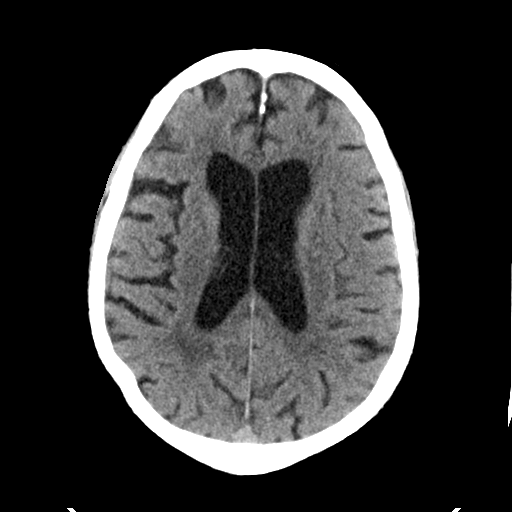
[im 23/32  brain]
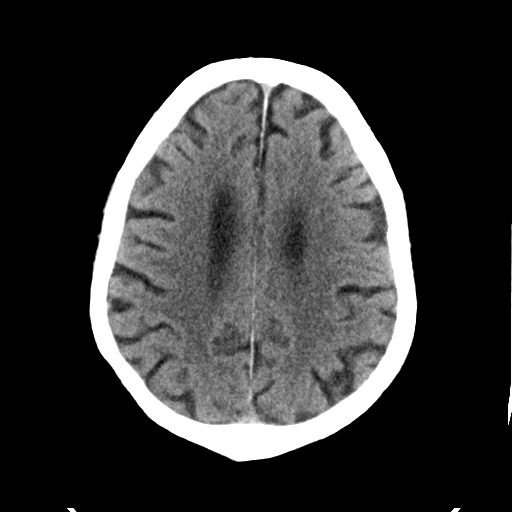
[im 26/32  brain]
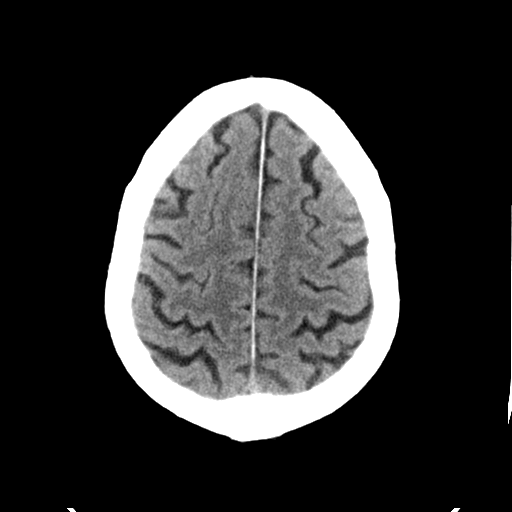
[im 29/32  brain]
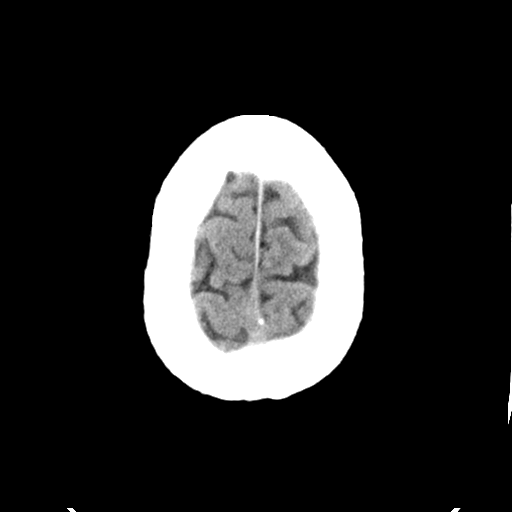
[im 29/32  bone]
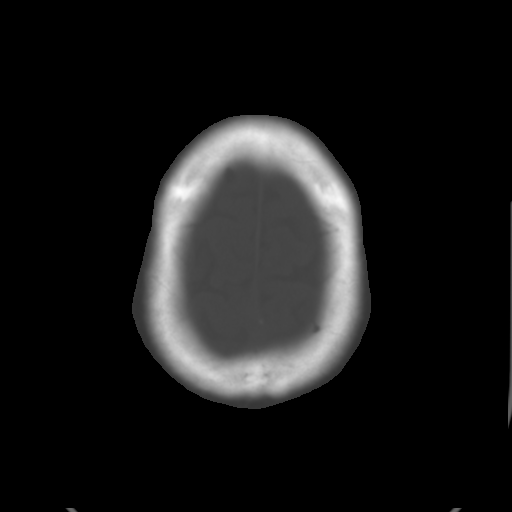

[Series 5: head 3.0 cor st · coronal · 0.31mm/px · 3 of 70 slices shown]
[im 24/70  brain]
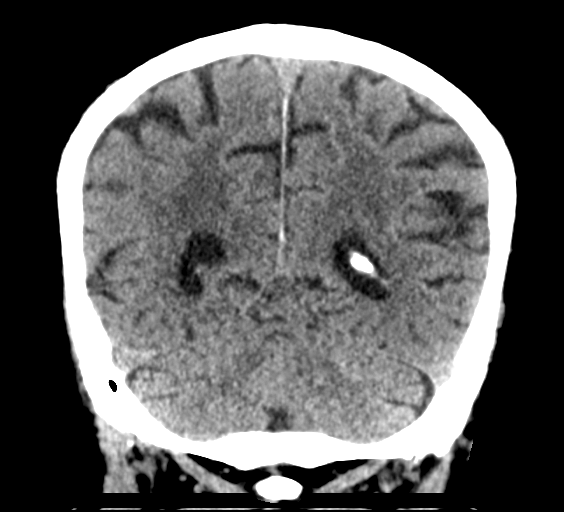
[im 31/70  brain]
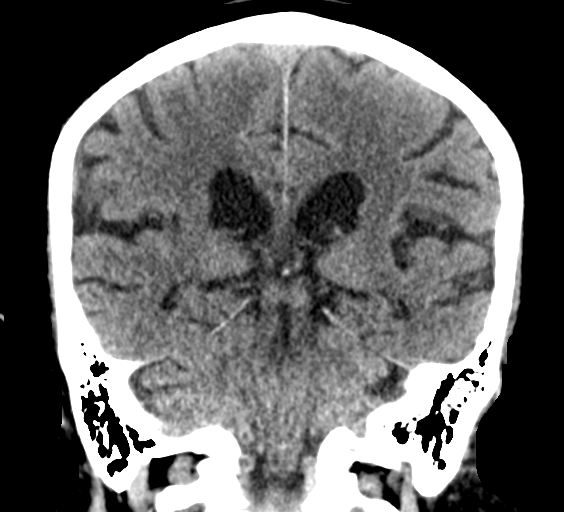
[im 39/70  brain]
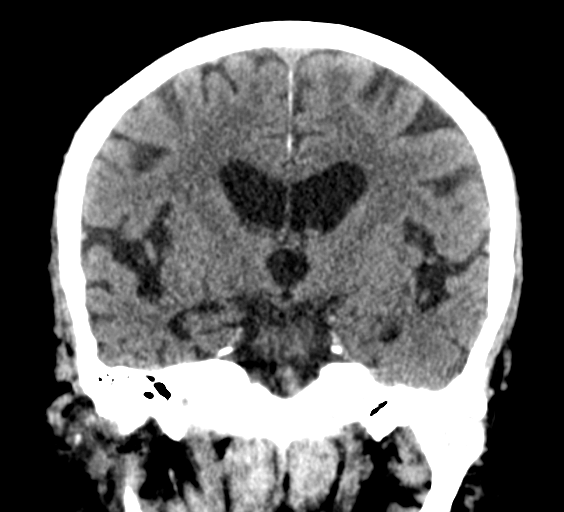

[Series 6: head 3.0 sag st · sagittal · 0.34mm/px · 3 of 61 slices shown]
[im 21/61  brain]
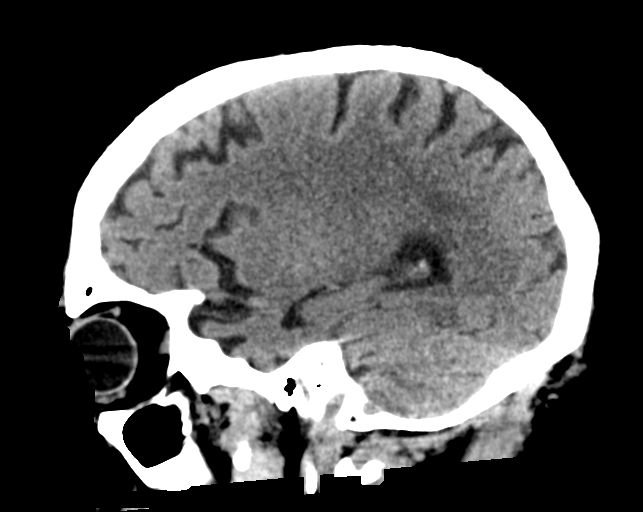
[im 31/61  brain]
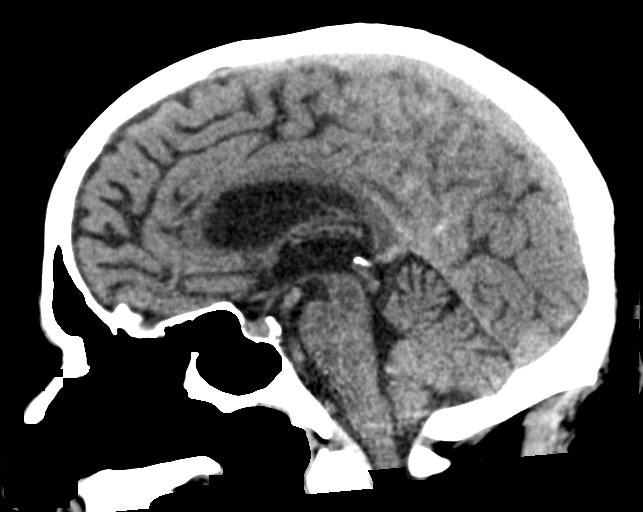
[im 41/61  brain]
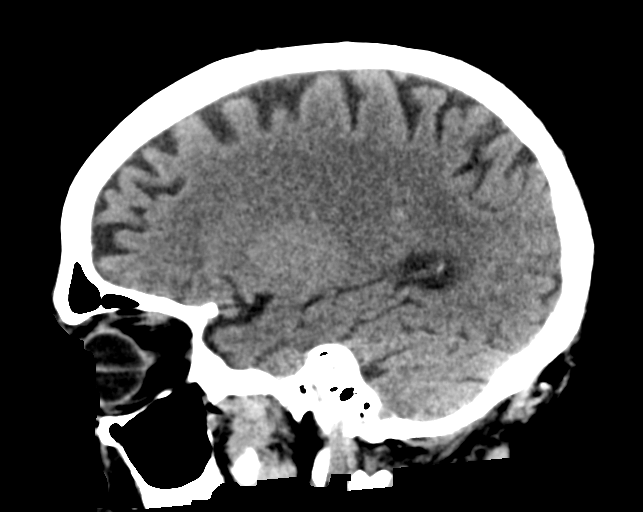

[15 of 47 positions shown; findings below may reference images not displayed]

FINDINGS: Brain: Asymmetric hypoattenuation of LEFT thalamus and posterior
limb internal capsule, possible acute infarction. No other definite
areas of cortical or deep nuclei asymmetry. No hemorrhage, mass
lesion, hydrocephalus, or extra-axial fluid. Generalized atrophy.
Extensive white matter disease.

Vascular: Calcification of the cavernous internal carotid arteries
consistent with cerebrovascular atherosclerotic disease. No signs of
intracranial large vessel occlusion.

Skull: Calvarium intact. Maxillary lucencies incompletely evaluated
suspicious for periodontal disease.

Sinuses/Orbits: No sinus disease.  Negative orbits.

Other: Mastoid air cells are clear.

ASPECTS (Alberta Stroke Program Early CT Score)

- Ganglionic level infarction (caudate, lentiform nuclei, internal
capsule, insula, M1-M3 cortex): 6

- Supraganglionic infarction (M4-M6 cortex): 3

Total score (0-10 with 10 being normal): 9
IMPRESSION: 1. Query LEFT thalamus/LEFT posterior limb internal capsule acute
infarct. Atrophy and small vessel disease.
2. ASPECTS is 9.
These results were communicated to Dr. Wirth at [DATE] pmon
11/09/2017by text page via the [employer] messaging system.

## 2020-05-16 DIAGNOSIS — Z23 Encounter for immunization: Secondary | ICD-10-CM | POA: Diagnosis not present

## 2022-02-15 ENCOUNTER — Other Ambulatory Visit: Payer: Self-pay | Admitting: Family

## 2022-02-15 ENCOUNTER — Ambulatory Visit
Admission: RE | Admit: 2022-02-15 | Discharge: 2022-02-15 | Disposition: A | Discharge status: Home / Self Care | Payer: Medicare PPO | Source: Ambulatory Visit | Attending: Family | Admitting: Family

## 2022-02-15 DIAGNOSIS — R52 Pain, unspecified: Secondary | ICD-10-CM

## 2022-06-08 ENCOUNTER — Emergency Department (HOSPITAL_COMMUNITY)
Admission: EM | Admit: 2022-06-08 | Discharge: 2022-06-09 | Discharge status: Against Medical Advice | Payer: Medicare PPO | Source: Home / Self Care

## 2022-06-08 DIAGNOSIS — Z7982 Long term (current) use of aspirin: Secondary | ICD-10-CM | POA: Insufficient documentation

## 2022-06-08 DIAGNOSIS — R2689 Other abnormalities of gait and mobility: Secondary | ICD-10-CM | POA: Diagnosis not present

## 2022-06-08 DIAGNOSIS — E782 Mixed hyperlipidemia: Secondary | ICD-10-CM | POA: Insufficient documentation

## 2022-06-08 DIAGNOSIS — Z1152 Encounter for screening for COVID-19: Secondary | ICD-10-CM | POA: Insufficient documentation

## 2022-06-08 DIAGNOSIS — Z9181 History of falling: Secondary | ICD-10-CM | POA: Diagnosis not present

## 2022-06-08 DIAGNOSIS — Z79899 Other long term (current) drug therapy: Secondary | ICD-10-CM | POA: Insufficient documentation

## 2022-06-08 DIAGNOSIS — E039 Hypothyroidism, unspecified: Secondary | ICD-10-CM | POA: Insufficient documentation

## 2022-06-08 DIAGNOSIS — R531 Weakness: Secondary | ICD-10-CM | POA: Insufficient documentation

## 2022-06-08 DIAGNOSIS — E440 Moderate protein-calorie malnutrition: Secondary | ICD-10-CM | POA: Diagnosis not present

## 2022-06-08 DIAGNOSIS — D696 Thrombocytopenia, unspecified: Secondary | ICD-10-CM | POA: Insufficient documentation

## 2022-06-08 DIAGNOSIS — N179 Acute kidney failure, unspecified: Secondary | ICD-10-CM | POA: Diagnosis not present

## 2022-06-08 DIAGNOSIS — Z5321 Procedure and treatment not carried out due to patient leaving prior to being seen by health care provider: Secondary | ICD-10-CM | POA: Insufficient documentation

## 2022-06-08 DIAGNOSIS — R2681 Unsteadiness on feet: Secondary | ICD-10-CM | POA: Diagnosis not present

## 2022-06-08 DIAGNOSIS — J101 Influenza due to other identified influenza virus with other respiratory manifestations: Secondary | ICD-10-CM | POA: Insufficient documentation

## 2022-06-08 DIAGNOSIS — M6281 Muscle weakness (generalized): Secondary | ICD-10-CM | POA: Diagnosis not present

## 2022-06-08 DIAGNOSIS — M6282 Rhabdomyolysis: Secondary | ICD-10-CM | POA: Diagnosis not present

## 2022-06-08 NOTE — ED Triage Notes (Signed)
Pt to ED via [facility]. Pt has not been able to walk around for a week d/t leg weakness.   Pt's family states that pt fell 5 days ago and has not been able to get up out of floor. Pt has left leg weakness since previous stroke 6 years ago. Pt c/o weakness in both legs. Pt denies pain or shortness of breath. Pt states he may have had urinary incontinence which is unusual for him.

## 2022-06-09 ENCOUNTER — Emergency Department (HOSPITAL_COMMUNITY): Payer: Medicare PPO

## 2022-06-09 ENCOUNTER — Encounter (HOSPITAL_COMMUNITY): Payer: Self-pay

## 2022-06-09 ENCOUNTER — Other Ambulatory Visit: Payer: Self-pay

## 2022-06-09 ENCOUNTER — Observation Stay (HOSPITAL_COMMUNITY)
Admission: EM | Admit: 2022-06-09 | Discharge: 2022-06-14 | Disposition: A | Discharge status: Skilled Nursing Facility | Payer: Medicare PPO | Attending: Internal Medicine | Admitting: Internal Medicine

## 2022-06-09 DIAGNOSIS — E039 Hypothyroidism, unspecified: Secondary | ICD-10-CM | POA: Insufficient documentation

## 2022-06-09 DIAGNOSIS — M6282 Rhabdomyolysis: Principal | ICD-10-CM | POA: Diagnosis present

## 2022-06-09 DIAGNOSIS — J101 Influenza due to other identified influenza virus with other respiratory manifestations: Secondary | ICD-10-CM | POA: Diagnosis not present

## 2022-06-09 DIAGNOSIS — I10 Essential (primary) hypertension: Secondary | ICD-10-CM

## 2022-06-09 DIAGNOSIS — R29898 Other symptoms and signs involving the musculoskeletal system: Secondary | ICD-10-CM | POA: Insufficient documentation

## 2022-06-09 DIAGNOSIS — N179 Acute kidney failure, unspecified: Secondary | ICD-10-CM

## 2022-06-09 DIAGNOSIS — E440 Moderate protein-calorie malnutrition: Secondary | ICD-10-CM | POA: Insufficient documentation

## 2022-06-09 DIAGNOSIS — D696 Thrombocytopenia, unspecified: Secondary | ICD-10-CM | POA: Insufficient documentation

## 2022-06-09 DIAGNOSIS — E782 Mixed hyperlipidemia: Secondary | ICD-10-CM | POA: Insufficient documentation

## 2022-06-09 LAB — CBC WITH DIFFERENTIAL/PLATELET
Abs Immature Granulocytes: 0.02 10*3/uL (ref 0.00–0.07)
Basophils Absolute: 0 10*3/uL (ref 0.0–0.1)
Basophils Relative: 0 %
Eosinophils Absolute: 0 10*3/uL (ref 0.0–0.5)
Eosinophils Relative: 1 %
HCT: 40.7 % (ref 39.0–52.0)
Hemoglobin: 13.5 g/dL (ref 13.0–17.0)
Immature Granulocytes: 0 %
Lymphocytes Relative: 10 %
Lymphs Abs: 0.7 10*3/uL (ref 0.7–4.0)
MCH: 28.8 pg (ref 26.0–34.0)
MCHC: 33.2 g/dL (ref 30.0–36.0)
MCV: 87 fL (ref 80.0–100.0)
Monocytes Absolute: 0.9 10*3/uL (ref 0.1–1.0)
Monocytes Relative: 14 %
Neutro Abs: 4.6 10*3/uL (ref 1.7–7.7)
Neutrophils Relative %: 75 %
Platelets: 127 10*3/uL — ABNORMAL LOW (ref 150–400)
RBC: 4.68 MIL/uL (ref 4.22–5.81)
RDW: 13.5 % (ref 11.5–15.5)
WBC: 6.2 10*3/uL (ref 4.0–10.5)
nRBC: 0 % (ref 0.0–0.2)

## 2022-06-09 LAB — DIFFERENTIAL
Abs Immature Granulocytes: 0.02 10*3/uL (ref 0.00–0.07)
Basophils Absolute: 0 10*3/uL (ref 0.0–0.1)
Basophils Relative: 0 %
Eosinophils Absolute: 0 10*3/uL (ref 0.0–0.5)
Eosinophils Relative: 0 %
Immature Granulocytes: 0 %
Lymphocytes Relative: 5 %
Lymphs Abs: 0.4 10*3/uL — ABNORMAL LOW (ref 0.7–4.0)
Monocytes Absolute: 0.9 10*3/uL (ref 0.1–1.0)
Monocytes Relative: 10 %
Neutro Abs: 7.5 10*3/uL (ref 1.7–7.7)
Neutrophils Relative %: 85 %

## 2022-06-09 LAB — URINALYSIS, ROUTINE W REFLEX MICROSCOPIC
Bilirubin Urine: NEGATIVE
Glucose, UA: NEGATIVE mg/dL
Ketones, ur: 20 mg/dL — AB
Nitrite: NEGATIVE
Protein, ur: 30 mg/dL — AB
Specific Gravity, Urine: 1.02 (ref 1.005–1.030)
pH: 5 (ref 5.0–8.0)

## 2022-06-09 LAB — CBC
HCT: 44.5 % (ref 39.0–52.0)
Hemoglobin: 15 g/dL (ref 13.0–17.0)
MCH: 29.3 pg (ref 26.0–34.0)
MCHC: 33.7 g/dL (ref 30.0–36.0)
MCV: 86.9 fL (ref 80.0–100.0)
Platelets: 152 10*3/uL (ref 150–400)
RBC: 5.12 MIL/uL (ref 4.22–5.81)
RDW: 13.2 % (ref 11.5–15.5)
WBC: 8.9 10*3/uL (ref 4.0–10.5)
nRBC: 0 % (ref 0.0–0.2)

## 2022-06-09 LAB — COMPREHENSIVE METABOLIC PANEL
ALT: 45 U/L — ABNORMAL HIGH (ref 0–44)
ALT: 40 U/L (ref 0–44)
AST: 159 U/L — ABNORMAL HIGH (ref 15–41)
AST: 112 U/L — ABNORMAL HIGH (ref 15–41)
Albumin: 4 g/dL (ref 3.5–5.0)
Albumin: 3.6 g/dL (ref 3.5–5.0)
Alkaline Phosphatase: 44 U/L (ref 38–126)
Alkaline Phosphatase: 36 U/L — ABNORMAL LOW (ref 38–126)
Anion gap: 18 — ABNORMAL HIGH (ref 5–15)
Anion gap: 10 (ref 5–15)
BUN: 50 mg/dL — ABNORMAL HIGH (ref 8–23)
BUN: 60 mg/dL — ABNORMAL HIGH (ref 8–23)
CO2: 17 mmol/L — ABNORMAL LOW (ref 22–32)
CO2: 23 mmol/L (ref 22–32)
Calcium: 9.4 mg/dL (ref 8.9–10.3)
Calcium: 9.1 mg/dL (ref 8.9–10.3)
Chloride: 104 mmol/L (ref 98–111)
Chloride: 106 mmol/L (ref 98–111)
Creatinine, Ser: 1.38 mg/dL — ABNORMAL HIGH (ref 0.61–1.24)
Creatinine, Ser: 1.6 mg/dL — ABNORMAL HIGH (ref 0.61–1.24)
GFR, Estimated: 49 mL/min — ABNORMAL LOW (ref >60)
GFR, Estimated: 41 mL/min — ABNORMAL LOW (ref >60)
Glucose, Bld: 75 mg/dL (ref 70–99)
Glucose, Bld: 121 mg/dL — ABNORMAL HIGH (ref 70–99)
Potassium: 4.2 mmol/L (ref 3.5–5.1)
Potassium: 4.2 mmol/L (ref 3.5–5.1)
Sodium: 139 mmol/L (ref 135–145)
Sodium: 139 mmol/L (ref 135–145)
Total Bilirubin: 1.1 mg/dL (ref 0.3–1.2)
Total Bilirubin: 1 mg/dL (ref 0.3–1.2)
Total Protein: 6.9 g/dL (ref 6.5–8.1)
Total Protein: 6.3 g/dL — ABNORMAL LOW (ref 6.5–8.1)

## 2022-06-09 LAB — RAPID URINE DRUG SCREEN, HOSP PERFORMED
Amphetamines: NOT DETECTED
Barbiturates: NOT DETECTED
Benzodiazepines: NOT DETECTED
Cocaine: NOT DETECTED
Opiates: NOT DETECTED
Tetrahydrocannabinol: NOT DETECTED

## 2022-06-09 LAB — TROPONIN I (HIGH SENSITIVITY)
Troponin I (High Sensitivity): 6 ng/L (ref <18)
Troponin I (High Sensitivity): 11 ng/L (ref <18)

## 2022-06-09 LAB — I-STAT CHEM 8, ED
BUN: 44 mg/dL — ABNORMAL HIGH (ref 8–23)
Calcium, Ion: 1.15 mmol/L (ref 1.15–1.40)
Chloride: 107 mmol/L (ref 98–111)
Creatinine, Ser: 1.1 mg/dL (ref 0.61–1.24)
Glucose, Bld: 73 mg/dL (ref 70–99)
HCT: 46 % (ref 39.0–52.0)
Hemoglobin: 15.6 g/dL (ref 13.0–17.0)
Potassium: 4 mmol/L (ref 3.5–5.1)
Sodium: 139 mmol/L (ref 135–145)
TCO2: 18 mmol/L — ABNORMAL LOW (ref 22–32)

## 2022-06-09 LAB — PROTIME-INR
INR: 1.1 (ref 0.8–1.2)
Prothrombin Time: 14.2 seconds (ref 11.4–15.2)

## 2022-06-09 LAB — CK
Total CK: 3511 U/L — ABNORMAL HIGH (ref 49–397)
Total CK: 1639 U/L — ABNORMAL HIGH (ref 49–397)

## 2022-06-09 LAB — RESP PANEL BY RT-PCR (RSV, FLU A&B, COVID)  RVPGX2
Influenza A by PCR: POSITIVE — AB
Influenza B by PCR: NEGATIVE
Resp Syncytial Virus by PCR: NEGATIVE
SARS Coronavirus 2 by RT PCR: NEGATIVE

## 2022-06-09 LAB — ETHANOL: Alcohol, Ethyl (B): <10 mg/dL (ref <10)

## 2022-06-09 LAB — APTT: aPTT: 28 seconds (ref 24–36)

## 2022-06-09 MED ORDER — LACTATED RINGERS IV SOLN: INTRAVENOUS | Status: AC

## 2022-06-09 MED ORDER — ACETAMINOPHEN 650 MG RE SUPP: 650.0000 mg | Freq: Four times a day (QID) | RECTAL | Status: DC | PRN

## 2022-06-09 MED ORDER — ENOXAPARIN SODIUM 30 MG/0.3ML IJ SOSY
30.0000 mg | PREFILLED_SYRINGE | INTRAMUSCULAR | Status: DC
  Administered 2022-06-10: 30 mg via SUBCUTANEOUS
  Filled 2022-06-09: qty 0.3

## 2022-06-09 MED ORDER — ONDANSETRON HCL 4 MG/2ML IJ SOLN: 4.0000 mg | Freq: Four times a day (QID) | INTRAMUSCULAR | Status: DC | PRN

## 2022-06-09 MED ORDER — ACETAMINOPHEN 325 MG PO TABS: 650.0000 mg | ORAL_TABLET | Freq: Four times a day (QID) | ORAL | Status: DC | PRN

## 2022-06-09 MED ORDER — DM-GUAIFENESIN ER 30-600 MG PO TB12
1.0000 | ORAL_TABLET | Freq: Two times a day (BID) | ORAL | Status: DC
  Administered 2022-06-10 – 2022-06-14 (×10): 1 via ORAL
  Filled 2022-06-09 (×11): qty 1

## 2022-06-09 MED ORDER — IPRATROPIUM-ALBUTEROL 0.5-2.5 (3) MG/3ML IN SOLN: 3.0000 mL | RESPIRATORY_TRACT | Status: DC | PRN

## 2022-06-09 MED ORDER — LACTATED RINGERS IV BOLUS
1000.0000 mL | Freq: Once | INTRAVENOUS | Status: AC
  Administered 2022-06-09: 1000 mL via INTRAVENOUS

## 2022-06-09 MED ORDER — ONDANSETRON HCL 4 MG PO TABS: 4.0000 mg | ORAL_TABLET | Freq: Four times a day (QID) | ORAL | Status: DC | PRN

## 2022-06-09 NOTE — ED Notes (Signed)
Patient transported to X-ray 

## 2022-06-09 NOTE — ED Provider Notes (Addendum)
[facility] EMERGENCY DEPARTMENT Provider Note   CSN: [id number] Arrival date & time: 06/08/22  2352     History  No chief complaint on file.   Brendan Lin is a 88 y.o. male.  HPI 88 year old male with a history of a prior stroke that resulted in left-sided leg weakness that has not fully resolved presents with worsening left leg weakness.  This started about 5 days ago.  Due to this weakness he fell.  He did not suffer any injuries but he was unable to get up for multiple days.  Has not been eating and drinking for the last few days.  Lives at home by himself.  He normally walks without assistance at all but now is unable to walk.  No fevers but he has been having a productive cough for the past few days.  No chest pain.  When asked, seems like his right arm is weak though it seems like it is more of a limited range of motion in the shoulder to where he cannot touch his face with his right hand.  However he denies any focal trauma to that area.  No headache or new visual complaints.  Home Medications Prior to Admission medications   Medication Sig Start Date End Date Taking? Authorizing Provider  aspirin EC 81 MG tablet Take 1 tablet (81 mg total) by mouth daily. 12/26/17   Frann Rider, NP  atorvastatin (LIPITOR) 40 MG tablet Take 1 tablet (40 mg total) by mouth daily at 6 PM. 11/10/17 12/10/17  Chundi, Verne Spurr, MD  brimonidine (ALPHAGAN) 0.2 % ophthalmic solution Place 1 drop into both eyes 2 (two) times daily.    [provider]  dorzolamide-timolol (COSOPT) 22.3-6.8 MG/ML ophthalmic solution Place 1 drop into both eyes 2 (two) times daily.    [provider]  latanoprost (XALATAN) 0.005 % ophthalmic solution Place 1 drop into both eyes at bedtime.    [provider]  levothyroxine (SYNTHROID, LEVOTHROID) 75 MCG tablet Take 75 mcg by mouth daily before breakfast.    [provider]      Allergies    Sulfa antibiotics    Review  of Systems   Review of Systems  Constitutional:  Negative for fever.  Respiratory:  Positive for cough.   Cardiovascular:  Negative for chest pain.  Gastrointestinal:  Negative for abdominal pain and diarrhea.  Genitourinary:  Negative for dysuria.  Neurological:  Positive for weakness. Negative for headaches.    Physical Exam Updated Vital Signs BP 128/85   Pulse 100   Temp 98.1 F (36.7 C) (Oral)   Resp (!) 23   SpO2 97%  Physical Exam Vitals and nursing note reviewed.  Constitutional:      General: He is not in acute distress.    Appearance: He is well-developed. He is not ill-appearing or diaphoretic.  HENT:     Head: Normocephalic and atraumatic.  Eyes:     Extraocular Movements: Extraocular movements intact.  Cardiovascular:     Rate and Rhythm: Normal rate and regular rhythm.     Pulses:          Dorsalis pedis pulses are detected w/ Doppler on the left side.     Heart sounds: Normal heart sounds.  Pulmonary:     Effort: Pulmonary effort is normal.     Breath sounds: Normal breath sounds.  Abdominal:     Palpations: Abdomen is soft.     Tenderness: There is no abdominal tenderness.  Skin:  General: Skin is warm and dry.  Neurological:     Mental Status: He is alert.     Comments: No facial droop or slurred speech. 5/5 strength in BUE, though some limitation in the shoulder joint itself. 5/5 strength RLE. 3/5 strength in left thigh, but beneath knee appears to have normal strength.     ED Results / Procedures / Treatments   Labs (all labs ordered are listed, but only abnormal results are displayed) Labs Reviewed  RESP PANEL BY RT-PCR (RSV, FLU A&B, COVID)  RVPGX2 - Abnormal; Notable for the following components:      Result Value   Influenza A by PCR POSITIVE (*)    All other components within normal limits  COMPREHENSIVE METABOLIC PANEL - Abnormal; Notable for the following components:   Glucose, Bld 121 (*)    BUN 60 (*)    Creatinine, Ser 1.60 (*)     Total Protein 6.3 (*)    AST 112 (*)    Alkaline Phosphatase 36 (*)    GFR, Estimated 41 (*)    All other components within normal limits  CBC WITH DIFFERENTIAL/PLATELET - Abnormal; Notable for the following components:   Platelets 127 (*)    All other components within normal limits  CK - Abnormal; Notable for the following components:   Total CK 1,639 (*)    All other components within normal limits  URINALYSIS, ROUTINE W REFLEX MICROSCOPIC  CK  CBC  CREATININE, SERUM  COMPREHENSIVE METABOLIC PANEL  CBC  MAGNESIUM  PHOSPHORUS  TROPONIN I (HIGH SENSITIVITY)  TROPONIN I (HIGH SENSITIVITY)    EKG EKG Interpretation  Date/Time:  Saturday June 09 2022 18:31:33 EST Ventricular Rate:  113 PR Interval:  181 QRS Duration: 89 QT Interval:  323 QTC Calculation: 443 R Axis:   0 Text Interpretation: Sinus tachycardia Atrial premature complexes Abnormal T, consider ischemia, anterior leads similar to earlier in the day Confirmed by Sherwood Gambler [phone]) on 06/09/2022 8:39:24 PM  Radiology DG Shoulder Right  Result Date: 06/09/2022 CLINICAL DATA:  Shoulder pain after fall EXAM: RIGHT SHOULDER - 2+ VIEW COMPARISON:  Radiographs 02/15/2022 FINDINGS: No acute fracture or dislocation. Slight superior subluxation of the humeral head in relation to the glenoid with narrowing of the subacromial space suggestive of rotator cuff pathology. IMPRESSION: No acute fracture. Superior subluxation of the humeral head suggestive of rotator cuff pathology, unchanged from 02/15/2022. Electronically Signed   By: Placido Sou M.D.   On: 06/09/2022 19:32   DG Chest 2 View  Result Date: 06/09/2022 CLINICAL DATA:  Status post fall 5 days ago.  Cough for a few days EXAM: CHEST - 2 VIEW COMPARISON:  None Available. FINDINGS: Normal cardiomediastinal silhouette given patient rotation. Possible mild perihilar and peribronchial infiltrates. No focal consolidation, pleural effusion, or pneumothorax. No  acute osseous abnormality. IMPRESSION: Question mild bronchitis/reactive airways.  No focal pneumonia. Electronically Signed   By: Placido Sou M.D.   On: 06/09/2022 19:30   MR BRAIN WO CONTRAST  Result Date: 06/09/2022 CLINICAL DATA:  Neuro deficit with acute stroke suspected EXAM: MRI HEAD WITHOUT CONTRAST TECHNIQUE: Multiplanar, multiecho pulse sequences of the brain and surrounding structures were obtained without intravenous contrast. COMPARISON:  Head CT from earlier today.  11/09/2017 brain MRI FINDINGS: Brain: No acute infarction, hemorrhage, hydrocephalus, extra-axial collection or mass lesion. Generalized brain atrophy, advanced. Mild for age chronic small vessel ischemia in the cerebral white matter. Chronic lacune in the left thalamus. No chronic blood products. Vascular: Normal flow  voids. Skull and upper cervical spine: Normal marrow signal. Sinuses/Orbits: Negative. IMPRESSION: 1. No acute or reversible finding. 2. Advanced brain atrophy that is progressed from 2019. Electronically Signed   By: Jorje Guild M.D.   On: 06/09/2022 07:14   CT HEAD WO CONTRAST  Result Date: 06/09/2022 CLINICAL DATA:  Neuro deficit, acute, stroke suspected EXAM: CT HEAD WITHOUT CONTRAST TECHNIQUE: Contiguous axial images were obtained from the base of the skull through the vertex without intravenous contrast. RADIATION DOSE REDUCTION: This exam was performed according to the departmental dose-optimization program which includes automated exposure control, adjustment of the mA and/or kV according to patient size and/or use of iterative reconstruction technique. COMPARISON:  11/09/2017. FINDINGS: Brain: Normal anatomic configuration. Moderate parenchymal volume loss is commensurate with the patient's age. Mild periventricular white matter changes are present likely reflecting the sequela of small vessel ischemia. Remote lacunar infarct noted within the left thalamus. No abnormal intra or extra-axial mass  lesion or fluid collection. No abnormal mass effect or midline shift. No evidence of acute intracranial hemorrhage or infarct. Mild ventriculomegaly is commensurate with the degree of parenchymal volume loss in likely reflects ex vacuo dilation. Cerebellum unremarkable. Vascular: No asymmetric hyperdense vasculature at the skull base. Skull: Intact Sinuses/Orbits: Paranasal sinuses are clear. Orbits are unremarkable. Other: Mastoid air cells and middle ear cavities are clear. IMPRESSION: 1. No acute intracranial hemorrhage or infarct. 2. Remote lacunar infarct within the left thalamus. 3. Mild senescent change. Electronically Signed   By: Fidela Salisbury M.D.   On: 06/09/2022 02:38    Procedures Procedures    Medications Ordered in ED Medications  lactated ringers bolus 1,000 mL (1,000 mLs Intravenous New Bag/Given 06/09/22 1845)    ED Course/ Medical Decision Making/ A&P                           Medical Decision Making Amount and/or Complexity of Data Reviewed Labs: ordered.    Details: Labs show acute kidney injury compared to baseline.  CK is elevated but improving from earlier.  Flu test is positive. Radiology: ordered and independent interpretation performed.    Details: No pneumonia.  No shoulder fracture. ECG/medicine tests: ordered and independent interpretation performed.    Details: Nonspecific but unchanged changes with some tachycardia  Risk Prescription drug management. Decision regarding hospitalization.   Patient presents with weakness that I think is recrudescence of his stroke symptoms due to an acute infection from influenza.  However he is outside the treatment window for influenza.  He appears to have an acute kidney injury which is probably at least partially due to rhabdomyolysis but also just from general decreased p.o. intake.  He was given a fluid bolus and will be continued on IV fluids.  Otherwise, he is hemodynamically stable.  MRI and CT from earlier were  unremarkable for acute pathology.  He will need admission.  Discussed with Dr. Josephine Cables.        Final Clinical Impression(s) / ED Diagnoses Final diagnoses:  Influenza A  Acute kidney injury [facility])  Non-traumatic rhabdomyolysis    Rx / DC Orders ED Discharge Orders     None         Sherwood Gambler, MD 06/09/22 2043    Sherwood Gambler, MD 06/09/22 2347

## 2022-06-09 NOTE — H&P (Signed)
History and Physical    Patient: Brendan Lin WCB:[id number] DOB: August 02, 1931 DOA: 06/09/2022 DOS: the patient was seen and examined on 06/10/2022 PCP: [facility]  Patient coming from: Home  Chief Complaint: No chief complaint on file.  HPI: Brendan Lin is a 88 y.o. male with medical history significant of hypothyroidism, hyperlipidemia, prior stroke with residual left-sided weakness who presents to the emergency department from home due to 5-day onset of worsening left leg weakness.  Patient states that he was trying to get something under his bed, so he knelt down so as to have an easy assess to under the bed.  When he wanted to get up from the kneeling position, he realized that his left leg was weak and was unable to get up.  He denies any fall. Patient was unable to eat and drink for a few days.  Patient lives alone and uses a walker to ambulate at baseline, but has not been able to walk with a walker since onset of symptoms (about 5 days ago).  He complained of right arm weakness which consists of limited range of motion of right shoulder which appears to be chronic, he states that he fell backwards about 4 to 5 weeks ago and has since have limited range of motion to this extremity.  He denies fever, headache, chest pain, shortness of breath, visual problems.  ED Course:  In the emergency department, temperature was 98.1 F, respiratory rate 23/min, pulse 100 bpm, BP 128/85, O2 sat 97%.  Workup in the ED showed normal CBC except thrombocytopenia.  BMP was normal except for blood glucose of 124, BUN/creatinine was 60/1.60.  Total CK 3,511 > 1,639, AST 112, ALT 40.  Influenza A was positive.  Influenza B B, SARS coronavirus 2, and RSV were negative. MRI brain without contrast showed no acute or reversible finding CT head without contrast showed no acute intracranial hemorrhage or infarct Right shoulder x-ray showed no acute fracture.  Superior subluxation of the humeral head  suggestive of rotator cuff pathology, unchanged from 02/15/2022 Chest x-ray showed question mild bronchitis/reactive airways.  No focal pneumonia IV hydration was provided.  Hospitalist was asked to admit patient for further evaluation and management.   Review of Systems: Review of systems as noted in the HPI. All other systems reviewed and are negative.   Past Medical History:  Diagnosis Date   Prostate cancer ([place])    Stroke [facility])    Thyroid disease    Tubular adenoma of colon    found during colonoscopy 2016   Past Surgical History:  Procedure Laterality Date   COLONOSCOPY     INGUINAL HERNIA REPAIR Left    PROSTATE BIOPSY      Social History:  reports that he has quit smoking. His smoking use included pipe and cigarettes. He has never used smokeless tobacco. He reports that he does not drink alcohol and does not use drugs.   Allergies  Allergen Reactions   Sulfa Antibiotics Rash    Family history: No pertinent family history   Prior to Admission medications   Medication Sig Start Date End Date Taking? Authorizing Provider  aspirin EC 81 MG tablet Take 1 tablet (81 mg total) by mouth daily. 12/26/17   Frann Rider, NP  atorvastatin (LIPITOR) 40 MG tablet Take 1 tablet (40 mg total) by mouth daily at 6 PM. 11/10/17 12/10/17  Chundi, Verne Spurr, MD  brimonidine (ALPHAGAN) 0.2 % ophthalmic solution Place 1 drop into both eyes 2 (two) times daily.  [provider]  dorzolamide-timolol (COSOPT) 22.3-6.8 MG/ML ophthalmic solution Place 1 drop into both eyes 2 (two) times daily.    [provider]  latanoprost (XALATAN) 0.005 % ophthalmic solution Place 1 drop into both eyes at bedtime.    [provider]  levothyroxine (SYNTHROID, LEVOTHROID) 75 MCG tablet Take 75 mcg by mouth daily before breakfast.    [provider]    Physical Exam: BP 120/79   Pulse 98   Temp 98.1 F (36.7 C) (Oral)   Resp (!) 21   SpO2 92%   General: 88 y.o.  year-old male well developed well nourished in no acute distress.  Alert and oriented x3. HEENT: NCAT, EOMI, dry mucous membrane Neck: Supple, trachea medial Cardiovascular: Tachycardia, regular rate and rhythm with no rubs or gallops.  No thyromegaly or JVD noted.  No lower extremity edema. 2/4 pulses in all 4 extremities. Respiratory: Clear to auscultation with no wheezes or rales. Good inspiratory effort. Abdomen: Soft, nontender nondistended with normal bowel sounds x4 quadrants. Muskuloskeletal: No cyanosis, clubbing or edema noted bilaterally Neuro: CN II-XII intact, left leg weakness.  Sensation, reflexes intact Skin: No ulcerative lesions noted or rashes Psychiatry: Judgement and insight appear normal. Mood is appropriate for condition and setting          Labs on Admission:  Basic Metabolic Panel: Recent Labs  Lab 06/09/22 0121 06/09/22 0138 06/09/22 1840  NA 139 139 139  K 4.2 4.0 4.2  CL 104 107 106  CO2 17*  --  23  GLUCOSE 75 73 121*  BUN 50* 44* 60*  CREATININE 1.38* 1.10 1.60*  CALCIUM 9.4  --  9.1   Liver Function Tests: Recent Labs  Lab 06/09/22 0121 06/09/22 1840  AST 159* 112*  ALT 45* 40  ALKPHOS 44 36*  BILITOT 1.1 1.0  PROT 6.9 6.3*  ALBUMIN 4.0 3.6   No results for input(s): "LIPASE", "AMYLASE" in the last 168 hours. No results for input(s): "AMMONIA" in the last 168 hours. CBC: Recent Labs  Lab 06/09/22 0121 06/09/22 0138 06/09/22 1840  WBC 8.9  --  6.2  NEUTROABS 7.5  --  4.6  HGB 15.0 15.6 13.5  HCT 44.5 46.0 40.7  MCV 86.9  --  87.0  PLT 152  --  127*   Cardiac Enzymes: Recent Labs  Lab 06/09/22 0121 06/09/22 1840  CKTOTAL 3,511* 1,639*    BNP (last 3 results) No results for input(s): "BNP" in the last 8760 hours.  ProBNP (last 3 results) No results for input(s): "PROBNP" in the last 8760 hours.  CBG: No results for input(s): "GLUCAP" in the last 168 hours.  Radiological Exams on Admission: DG Shoulder  Right  Result Date: 06/09/2022 CLINICAL DATA:  Shoulder pain after fall EXAM: RIGHT SHOULDER - 2+ VIEW COMPARISON:  Radiographs 02/15/2022 FINDINGS: No acute fracture or dislocation. Slight superior subluxation of the humeral head in relation to the glenoid with narrowing of the subacromial space suggestive of rotator cuff pathology. IMPRESSION: No acute fracture. Superior subluxation of the humeral head suggestive of rotator cuff pathology, unchanged from 02/15/2022. Electronically Signed   By: Placido Sou M.D.   On: 06/09/2022 19:32   DG Chest 2 View  Result Date: 06/09/2022 CLINICAL DATA:  Status post fall 5 days ago.  Cough for a few days EXAM: CHEST - 2 VIEW COMPARISON:  None Available. FINDINGS: Normal cardiomediastinal silhouette given patient rotation. Possible mild perihilar and peribronchial infiltrates. No focal consolidation, pleural effusion, or pneumothorax.  No acute osseous abnormality. IMPRESSION: Question mild bronchitis/reactive airways.  No focal pneumonia. Electronically Signed   By: Placido Sou M.D.   On: 06/09/2022 19:30   MR BRAIN WO CONTRAST  Result Date: 06/09/2022 CLINICAL DATA:  Neuro deficit with acute stroke suspected EXAM: MRI HEAD WITHOUT CONTRAST TECHNIQUE: Multiplanar, multiecho pulse sequences of the brain and surrounding structures were obtained without intravenous contrast. COMPARISON:  Head CT from earlier today.  11/09/2017 brain MRI FINDINGS: Brain: No acute infarction, hemorrhage, hydrocephalus, extra-axial collection or mass lesion. Generalized brain atrophy, advanced. Mild for age chronic small vessel ischemia in the cerebral white matter. Chronic lacune in the left thalamus. No chronic blood products. Vascular: Normal flow voids. Skull and upper cervical spine: Normal marrow signal. Sinuses/Orbits: Negative. IMPRESSION: 1. No acute or reversible finding. 2. Advanced brain atrophy that is progressed from 2019. Electronically Signed   By: Jorje Guild  M.D.   On: 06/09/2022 07:14   CT HEAD WO CONTRAST  Result Date: 06/09/2022 CLINICAL DATA:  Neuro deficit, acute, stroke suspected EXAM: CT HEAD WITHOUT CONTRAST TECHNIQUE: Contiguous axial images were obtained from the base of the skull through the vertex without intravenous contrast. RADIATION DOSE REDUCTION: This exam was performed according to the departmental dose-optimization program which includes automated exposure control, adjustment of the mA and/or kV according to patient size and/or use of iterative reconstruction technique. COMPARISON:  11/09/2017. FINDINGS: Brain: Normal anatomic configuration. Moderate parenchymal volume loss is commensurate with the patient's age. Mild periventricular white matter changes are present likely reflecting the sequela of small vessel ischemia. Remote lacunar infarct noted within the left thalamus. No abnormal intra or extra-axial mass lesion or fluid collection. No abnormal mass effect or midline shift. No evidence of acute intracranial hemorrhage or infarct. Mild ventriculomegaly is commensurate with the degree of parenchymal volume loss in likely reflects ex vacuo dilation. Cerebellum unremarkable. Vascular: No asymmetric hyperdense vasculature at the skull base. Skull: Intact Sinuses/Orbits: Paranasal sinuses are clear. Orbits are unremarkable. Other: Mastoid air cells and middle ear cavities are clear. IMPRESSION: 1. No acute intracranial hemorrhage or infarct. 2. Remote lacunar infarct within the left thalamus. 3. Mild senescent change. Electronically Signed   By: Fidela Salisbury M.D.   On: 06/09/2022 02:38    EKG: I independently viewed the EKG done and my findings are as followed: Sinus tachycardia at a rate of 113 bpm with APCs  Assessment/Plan Present on Admission:  Rhabdomyolysis  Principal Problem:   Rhabdomyolysis Active Problems:   Influenza A   AKI (acute kidney injury) ([place])   Left leg weakness   Thrombocytopenia ([facility])   Acquired  hypothyroidism   Mixed hyperlipidemia  Acute rhabdomyolysis Total CK 3,511 > 1,639 Continue IV hydration Continue to monitor CK levels  Influenza A Patient appears to have been outside treatment window for influenza ( >48hrs) Continue supportive measures Continue duo nebs, Mucinex,  Continue incentive spirometry and flutter valve  Acute kidney injury Dehydration BUN/creatinine was 60/1.60 Continue IV hydration Renally adjust medications, avoid nephrotoxic agents/dehydration/hypotension  Left leg weakness Questionable fall at home Patient denies any history of fall MRI of brain showed: No acute or reversible finding. Advanced brain atrophy that is progressed from 2019. Patient denies any low back pain or radiculopathy Continue treatment for rhabdomyolysis and monitor for improvement Continue fall precaution Continue PT/OT eval and treat    Restricted range of motion of right shoulder Continue PT/OT eval and treat   Thrombocytopenia possibly reactive Continue to monitor platelet levels  Acquired hypothyroidism Continue  Synthroid  Mixed hyperlipidemia Continue Lipitor  DVT prophylaxis: Lovenox  Code Status: Full code  Family Communication: None at bedside  Severity of Illness: The appropriate patient status for this patient is INPATIENT. Inpatient status is judged to be reasonable and necessary in order to provide the required intensity of service to ensure the patient's safety. The patient's presenting symptoms, physical exam findings, and initial radiographic and laboratory data in the context of their chronic comorbidities is felt to place them at high risk for further clinical deterioration. Furthermore, it is not anticipated that the patient will be medically stable for discharge from the hospital within 2 midnights of admission.   * I certify that at the point of admission it is my clinical judgment that the patient will require inpatient hospital care spanning  beyond 2 midnights from the point of admission due to high intensity of service, high risk for further deterioration and high frequency of surveillance required.*  Author: Bernadette Hoit, DO 06/10/2022 12:00 AM  For on call review [email or web address].

## 2022-06-09 NOTE — ED Notes (Signed)
Pt is sitting in recliner in waiting room. Pt hard of hearing

## 2022-06-09 NOTE — ED Provider Triage Note (Signed)
Emergency Medicine Provider Triage Evaluation Note  Brendan Lin , a 88 y.o. male  was evaluated in triage.  Pt presents with his granddaughter reports patient fell on Sunday, lives alone, and reportedly has been on the ground since that time.  Patient was able to pull a blanket on the floor but did not want to disturb his family, was too weak to get up secondary to new onset left lower extremity weakness that prompted his fall.  Patient with history of stroke with some left-sided lower extremity weakness since that time but now patient reports he is barely able to move the leg.  States he was urinating on himself and has not had anything to drink for at least 3 days.  Also has history of glaucoma of the left eye for which she is blind in the left.  Review of Systems  Positive: As above Negative: As above  Physical Exam  BP 109/81   Pulse (!) 102   Temp 98.1 F (36.7 C)   Resp 17   Ht '5\' 6"'$  (1.676 m)   Wt 61.2 kg   SpO2 99%   BMI 21.79 kg/m  Gen:   Awake, no distress   Resp:  Normal effort  MSK:   Moves extremities without difficulty  Other:  Blind in the left eye at baseline.  EOMs impacted secondary to this though intact.  Patient with left-sided facial droop which is new according to granddaughter, unable to push tongue into the left cheek were deviated to the left side on exam.  Symmetric sensation and strength in the upper extremities bilaterally.  Normal sensation bilateral lower extremities patient unable to lift left leg off the chair or extend/flex the knee at all compared to normal strength and sensation on the right. Regular rhythm with tachycardic rate, lungs CTAB.  Medical Decision Making  Medically screening exam initiated at 12:48 AM.  Appropriate orders placed.  Brendan Lin was informed that the remainder of the evaluation will be completed by another provider, this initial triage assessment does not replace that evaluation, and the importance of remaining in the ED until  their evaluation is complete.  Patient with obvious new neurologic deficits the last known well was greater than 4 days ago.  Additionally concern clinically for dehydration given immobilization on the ground for greater than 4 days.  This chart was dictated using voice recognition software, Dragon. Despite the best efforts of this provider to proofread and correct errors, errors may still occur which can change documentation meaning.    Brendan Darling, PA-C 06/09/22 [phone]

## 2022-06-09 NOTE — ED Notes (Signed)
Pt resting in bed with no acute distress noted. Awaiting results and dispo.

## 2022-06-09 NOTE — ED Notes (Signed)
Called pt x3 for vitals, no response. 

## 2022-06-10 ENCOUNTER — Encounter (HOSPITAL_COMMUNITY): Payer: Self-pay | Admitting: Internal Medicine

## 2022-06-10 DIAGNOSIS — N179 Acute kidney failure, unspecified: Secondary | ICD-10-CM | POA: Diagnosis not present

## 2022-06-10 DIAGNOSIS — D696 Thrombocytopenia, unspecified: Secondary | ICD-10-CM | POA: Insufficient documentation

## 2022-06-10 DIAGNOSIS — E039 Hypothyroidism, unspecified: Secondary | ICD-10-CM | POA: Insufficient documentation

## 2022-06-10 DIAGNOSIS — J101 Influenza due to other identified influenza virus with other respiratory manifestations: Secondary | ICD-10-CM | POA: Diagnosis not present

## 2022-06-10 DIAGNOSIS — M6282 Rhabdomyolysis: Secondary | ICD-10-CM | POA: Diagnosis not present

## 2022-06-10 DIAGNOSIS — E782 Mixed hyperlipidemia: Secondary | ICD-10-CM | POA: Insufficient documentation

## 2022-06-10 LAB — CBC
HCT: 35.5 % — ABNORMAL LOW (ref 39.0–52.0)
HCT: 36.6 % — ABNORMAL LOW (ref 39.0–52.0)
Hemoglobin: 12.5 g/dL — ABNORMAL LOW (ref 13.0–17.0)
Hemoglobin: 12.5 g/dL — ABNORMAL LOW (ref 13.0–17.0)
MCH: 29.3 pg (ref 26.0–34.0)
MCH: 29.7 pg (ref 26.0–34.0)
MCHC: 34.2 g/dL (ref 30.0–36.0)
MCHC: 35.2 g/dL (ref 30.0–36.0)
MCV: 84.3 fL (ref 80.0–100.0)
MCV: 85.7 fL (ref 80.0–100.0)
Platelets: 107 10*3/uL — ABNORMAL LOW (ref 150–400)
Platelets: 108 10*3/uL — ABNORMAL LOW (ref 150–400)
RBC: 4.21 MIL/uL — ABNORMAL LOW (ref 4.22–5.81)
RBC: 4.27 MIL/uL (ref 4.22–5.81)
RDW: 13.3 % (ref 11.5–15.5)
RDW: 13.3 % (ref 11.5–15.5)
WBC: 5.6 10*3/uL (ref 4.0–10.5)
WBC: 5.7 10*3/uL (ref 4.0–10.5)
nRBC: 0 % (ref 0.0–0.2)
nRBC: 0 % (ref 0.0–0.2)

## 2022-06-10 LAB — COMPREHENSIVE METABOLIC PANEL
ALT: 35 U/L (ref 0–44)
AST: 95 U/L — ABNORMAL HIGH (ref 15–41)
Albumin: 3.2 g/dL — ABNORMAL LOW (ref 3.5–5.0)
Alkaline Phosphatase: 32 U/L — ABNORMAL LOW (ref 38–126)
Anion gap: 11 (ref 5–15)
BUN: 47 mg/dL — ABNORMAL HIGH (ref 8–23)
CO2: 22 mmol/L (ref 22–32)
Calcium: 8.7 mg/dL — ABNORMAL LOW (ref 8.9–10.3)
Chloride: 107 mmol/L (ref 98–111)
Creatinine, Ser: 1.27 mg/dL — ABNORMAL HIGH (ref 0.61–1.24)
GFR, Estimated: 54 mL/min — ABNORMAL LOW (ref >60)
Glucose, Bld: 87 mg/dL (ref 70–99)
Potassium: 3.5 mmol/L (ref 3.5–5.1)
Sodium: 140 mmol/L (ref 135–145)
Total Bilirubin: 1.1 mg/dL (ref 0.3–1.2)
Total Protein: 5.5 g/dL — ABNORMAL LOW (ref 6.5–8.1)

## 2022-06-10 LAB — CREATININE, SERUM
Creatinine, Ser: 1.27 mg/dL — ABNORMAL HIGH (ref 0.61–1.24)
GFR, Estimated: 54 mL/min — ABNORMAL LOW (ref >60)

## 2022-06-10 LAB — PHOSPHORUS: Phosphorus: 2.2 mg/dL — ABNORMAL LOW (ref 2.5–4.6)

## 2022-06-10 LAB — CK: Total CK: 1176 U/L — ABNORMAL HIGH (ref 49–397)

## 2022-06-10 LAB — MAGNESIUM: Magnesium: 2.4 mg/dL (ref 1.7–2.4)

## 2022-06-10 MED ORDER — ATORVASTATIN CALCIUM 40 MG PO TABS
40.0000 mg | ORAL_TABLET | Freq: Every day | ORAL | Status: DC
  Administered 2022-06-10 – 2022-06-12 (×3): 40 mg via ORAL
  Filled 2022-06-10 (×4): qty 1

## 2022-06-10 MED ORDER — LEVOTHYROXINE SODIUM 75 MCG PO TABS
75.0000 ug | ORAL_TABLET | Freq: Every day | ORAL | Status: DC
  Administered 2022-06-10 – 2022-06-14 (×5): 75 ug via ORAL
  Filled 2022-06-10 (×5): qty 1

## 2022-06-10 MED ORDER — ASPIRIN 81 MG PO TBEC
81.0000 mg | DELAYED_RELEASE_TABLET | Freq: Every day | ORAL | Status: DC
  Administered 2022-06-10 – 2022-06-14 (×5): 81 mg via ORAL
  Filled 2022-06-10 (×5): qty 1

## 2022-06-10 MED ORDER — ENOXAPARIN SODIUM 40 MG/0.4ML IJ SOSY
40.0000 mg | PREFILLED_SYRINGE | INTRAMUSCULAR | Status: DC
  Administered 2022-06-13 – 2022-06-14 (×2): 40 mg via SUBCUTANEOUS
  Filled 2022-06-10 (×4): qty 0.4

## 2022-06-10 MED ORDER — SORBITOL 70 % SOLN: 960.0000 mL | TOPICAL_OIL | Freq: Once | ORAL | Status: DC | PRN

## 2022-06-10 MED ORDER — POLYETHYLENE GLYCOL 3350 17 G PO PACK
17.0000 g | PACK | Freq: Two times a day (BID) | ORAL | Status: DC
  Administered 2022-06-10 – 2022-06-14 (×8): 17 g via ORAL
  Filled 2022-06-10 (×9): qty 1

## 2022-06-10 NOTE — Plan of Care (Signed)

## 2022-06-10 NOTE — Evaluation (Signed)
Physical Therapy Evaluation Patient Details Name: Brendan Lin MRN: [id number] DOB: 1931/11/26 Today's Date: 06/10/2022  History of Present Illness  Reason Helzer is a 88 y.o. male with a PMH significant for hypothyroidism, hyperlipidemia, prior stroke with residual left-sided weakness. He was admitted after falling at home and unable to get up for 5 days.  Found to have Rhabdomyolysis and positive for Influenza A. Pain in R shoulder negative for frature, but superior subluxation unchanged from 02/15/2022.  Clinical Impression  Patient presents with decreased mobility due to weakness in LE's recent fall and unable to rise at home several days and decreased activity tolerance.  Currently min A for mobility in the room with RW.  At baseline walks with cane only outdoors and takes the bus to store, lives alone and completes ADL's.  Feel he will need continued skilled PT in the acute setting.  Could go home if had assistance, but reports his daughter takes care of her mother.  May need STSNF, though not sure pt will agree.  PT to follow up.        Recommendations for follow up therapy are one component of a multi-disciplinary discharge planning process, led by the attending physician.  Recommendations may be updated based on patient status, additional functional criteria and insurance authorization.  Follow Up Recommendations Skilled nursing-short term rehab (<3 hours/day) Can patient physically be transported by private vehicle: Yes    Assistance Recommended at Discharge Intermittent Supervision/Assistance  Patient can return home with the following  A little help with walking and/or transfers;A little help with bathing/dressing/bathroom;Assist for transportation    Equipment Recommendations None recommended by PT  Recommendations for Other Services       Functional Status Assessment Patient has had a recent decline in their functional status and demonstrates the ability to make significant  improvements in function in a reasonable and predictable amount of time.     Precautions / Restrictions Precautions Precautions: Fall      Mobility  Bed Mobility Overal bed mobility: Needs Assistance Bed Mobility: Rolling, Sidelying to Sit Rolling: Supervision Sidelying to sit: Min assist       General bed mobility comments: cues for technique and using UE's to help and with min A able to lift trunk    Transfers Overall transfer level: Needs assistance Equipment used: Rolling walker (2 wheels) Transfers: Sit to/from Stand Sit to Stand: From elevated surface, Min guard           General transfer comment: stood without using RW minguard for balance from higher surface    Ambulation/Gait Ambulation/Gait assistance: Min guard Gait Distance (Feet): 12 Feet Assistive device: Rolling walker (2 wheels) Gait Pattern/deviations: Step-to pattern, Step-through pattern, Shuffle, Decreased stride length       General Gait Details: walked around bed only due to wanting to eat breakfast; minguard A for balance and maneuvering around bed and IV pole  Stairs            Wheelchair Mobility    Modified Rankin (Stroke Patients Only)       Balance Overall balance assessment: Needs assistance   Sitting balance-Leahy Scale: Good     Standing balance support: No upper extremity supported Standing balance-Leahy Scale: Fair Standing balance comment: initial standing without UE support, needed walker for ambulation                             Pertinent Vitals/Pain Pain Assessment Pain Assessment: Faces Faces Pain  Scale: Hurts a little bit Pain Location: R shoulder with movement Pain Descriptors / Indicators: Discomfort Pain Intervention(s): Limited activity within patient's tolerance    Home Living Family/patient expects to be discharged to:: Private residence Living Arrangements: Alone Available Help at Discharge: Family Type of Home: Apartment Home  Access: Level entry       Home Layout: One level Home Equipment: Rollator (4 wheels);Cane - single point      Prior Function Prior Level of Function : Needs assist             Mobility Comments: uses cane outside ADLs Comments: family/friends assist with groceries, he does not drive, takes bus to store     Hand Dominance   Dominant Hand: Right    Extremity/Trunk Assessment   Upper Extremity Assessment Upper Extremity Assessment: Defer to OT evaluation    Lower Extremity Assessment Lower Extremity Assessment: Overall WFL for tasks assessed       Communication   Communication: No difficulties  Cognition Arousal/Alertness: Awake/alert Behavior During Therapy: WFL for tasks assessed/performed Overall Cognitive Status: No family/caregiver present to determine baseline cognitive functioning                                 General Comments: alert and generally WNL except states he would not want daughter to stay with him or go to her home due to she is taking care of her mother, denies need for help        General Comments      Exercises     Assessment/Plan    PT Assessment Patient needs continued PT services  PT Problem List Decreased strength;Decreased safety awareness;Decreased balance;Decreased mobility;Decreased activity tolerance       PT Treatment Interventions DME instruction;Balance training;Gait training;Functional mobility training;Patient/family education;Therapeutic activities;Therapeutic exercise    PT Goals (Current goals can be found in the Care Plan section)  Acute Rehab PT Goals Patient Stated Goal: to return home PT Goal Formulation: With patient Time For Goal Achievement: 06/24/22 Potential to Achieve Goals: Good    Frequency Min 3X/week     Co-evaluation               AM-PAC PT "6 Clicks" Mobility  Outcome Measure Help needed turning from your back to your side while in a flat bed without using bedrails?: A  Little Help needed moving from lying on your back to sitting on the side of a flat bed without using bedrails?: A Little Help needed moving to and from a bed to a chair (including a wheelchair)?: A Little Help needed standing up from a chair using your arms (e.g., wheelchair or bedside chair)?: A Little Help needed to walk in hospital room?: Total Help needed climbing 3-5 steps with a railing? : Total 6 Click Score: 14    End of Session Equipment Utilized During Treatment: Gait belt Activity Tolerance: Patient tolerated treatment well Patient left: in chair   PT Visit Diagnosis: Other abnormalities of gait and mobility (R26.89);History of falling (Z91.81);Muscle weakness (generalized) (M62.81)    Time: 1010-1041 PT Time Calculation (min) (ACUTE ONLY): 31 min   Charges:   PT Evaluation $PT Eval Moderate Complexity: 1 Mod PT Treatments $Gait Training: 8-22 mins        Magda Kiel, PT Acute Rehabilitation Services [phone] 06/10/2022   Reginia Naas 06/10/2022, 11:14 AM

## 2022-06-10 NOTE — Progress Notes (Signed)
PROGRESS NOTE  Brendan Lin    DOB: March 27, 1932, 88 y.o.  VHQ:[id number]    Code Status: Full Code   DOA: 06/09/2022   LOS: 1   Brief hospital course  Brendan Lin is a 88 y.o. male with a PMH significant for hypothyroidism, hyperlipidemia, prior stroke with residual left-sided weakness.   They presented from home where he lives alone to the ED on 06/09/2022 with down x 5 days. He denies fall, injury, or LOC. He lowered himself to the ground to look under bed and was unable to get back up. Remembers the whole incident and endorses feeling grateful to be alive.   In the ED, it was found that they had temperature was 98.1 F, respiratory rate 23/min, pulse 100 bpm, BP 128/85, O2 sat 97%. normal CBC except thrombocytopenia.  BMP was normal except for blood glucose of 124, BUN/creatinine was 60/1.60.  Total CK 3,511 > 1,639, AST 112, ALT 40.  Influenza A was positive.  Influenza B B, SARS coronavirus 2, and RSV were negative.  Significant findings included MRI brain without contrast showed no acute or reversible finding CT head without contrast showed no acute intracranial hemorrhage or infarct Right shoulder x-ray showed no acute fracture.  Superior subluxation of the humeral head suggestive of rotator cuff pathology, unchanged from 02/15/2022 Chest x-ray showed question mild bronchitis/reactive airways.  No focal pneumonia.  They were initially treated with IVF.   Patient was admitted to medicine service for further workup and management of mild rhabdomyolysis and weakness as outlined in detail below.  06/10/22 -stable, improved  Assessment & Plan  Principal Problem:   Rhabdomyolysis Active Problems:   Influenza A   AKI (acute kidney injury) ([place])   Left leg weakness   Thrombocytopenia (HCC)   Acquired hypothyroidism   Mixed hyperlipidemia  Acute rhabdomyolysis- improving Total CK 3,511 > 1,639>1,176 - Continue IV hydration another 12 hours and then discontinue as patient is  tolerating PO hydration and improving CK levels - PT/OT. Suspect he will need STR   Influenza A- stable ORA. Outside treatment window.  - Continue supportive measures - Continue duo nebs, Mucinex PRN - Continue incentive spirometry and flutter valve   Acute kidney injury- Cr 1.27 from baseline of 0.8.  - Continue IV hydration Renally adjust medications, avoid nephrotoxic agents/dehydration/hypotension   Left leg weakness- residual from prior CVA. Denies acute injury. No acute findings on brain imaging.  - PT/OT   Restricted range of motion of right shoulder Continue PT/OT eval and treat    Thrombocytopenia- stable possibly reactive Continue to monitor platelet levels   Acquired hypothyroidism - Continue Synthroid   Mixed hyperlipidemia - Continue Lipitor  Body mass index is 18.47 kg/m.  VTE ppx: enoxaparin (LOVENOX) injection 30 mg Start: 06/10/22 0800 SCDs Start: 06/09/22 2324  Diet:     Diet   Diet Heart Room service appropriate? Yes; Fluid consistency: Thin   Consultants: None   Subjective 06/10/22    Pt reports feeling improved. He does not feel that he can ambulate at this time. Denies chest pain, SOB, headache. 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    Objective   Vitals:   06/10/22 0016 06/10/22 0451 06/10/22 0648 06/10/22 0721  BP: 123/70 112/82 (!) 125/49 127/76  Pulse: 93 87 (!) 55 88  Resp: '18 17 18 18  '$ Temp: 98.3 F (36.8 C) 97.9 F (36.6 C) 97.9 F (36.6 C)  98 F (36.7 C)  TempSrc: Oral Oral Oral Oral  SpO2: 99% 98% 99% 98%  Weight: 57.5 kg  55.1 kg   Height: '5\' 6"'$  (1.676 m)  '5\' 8"'$  (1.727 m)     Intake/Output Summary (Last 24 hours) at 06/10/2022 0731 Last data filed at 06/10/2022 9417 Gross per 24 hour  Intake 1612.73 ml  Output --  Net 1612.73 ml   Filed Weights   06/10/22 0016 06/10/22 0648  Weight: 57.5 kg 55.1 kg     Physical Exam:  General: awake, alert,  NAD HEENT: atraumatic, clear conjunctiva, anicteric sclera, MMM, hearing grossly normal Respiratory: normal respiratory effort. Cardiovascular: quick capillary refill, normal S1/S2, RRR, no JVD Gastrointestinal: soft, NT, ND Nervous: A&O x3. no gross focal neurologic deficits, normal speech Extremities: frail, no edema. Moving all spontaneously. LE weakness, R worse than L Skin: dry, intact, normal temperature, normal color. No rashes, lesions or ulcers on exposed skin Psychiatry: normal mood, congruent affect  Labs   I have personally reviewed the following labs and imaging studies CBC    Component Value Date/Time   WBC 5.6 06/10/2022 0206   RBC 4.27 06/10/2022 0206   HGB 12.5 (L) 06/10/2022 0206   HCT 36.6 (L) 06/10/2022 0206   PLT 107 (L) 06/10/2022 0206   MCV 85.7 06/10/2022 0206   MCH 29.3 06/10/2022 0206   MCHC 34.2 06/10/2022 0206   RDW 13.3 06/10/2022 0206   LYMPHSABS 0.7 06/09/2022 1840   MONOABS 0.9 06/09/2022 1840   EOSABS 0.0 06/09/2022 1840   BASOSABS 0.0 06/09/2022 1840      Latest Ref Rng & Units 06/10/2022    2:06 AM 06/10/2022    2:05 AM 06/09/2022    6:40 PM  BMP  Glucose 70 - 99 mg/dL 87   121   BUN 8 - 23 mg/dL 47   60   Creatinine 0.61 - 1.24 mg/dL 1.27  1.27  1.60   Sodium 135 - 145 mmol/L 140   139   Potassium 3.5 - 5.1 mmol/L 3.5   4.2   Chloride 98 - 111 mmol/L 107   106   CO2 22 - 32 mmol/L 22   23   Calcium 8.9 - 10.3 mg/dL 8.7   9.1     DG Shoulder Right  Result Date: 06/09/2022 CLINICAL DATA:  Shoulder pain after fall EXAM: RIGHT SHOULDER - 2+ VIEW COMPARISON:  Radiographs 02/15/2022 FINDINGS: No acute fracture or dislocation. Slight superior subluxation of the humeral head in relation to the glenoid with narrowing of the subacromial space suggestive of rotator cuff pathology. IMPRESSION: No acute fracture. Superior subluxation of the humeral head suggestive of rotator cuff pathology, unchanged from 02/15/2022. Electronically Signed   By:  Placido Sou M.D.   On: 06/09/2022 19:32   DG Chest 2 View  Result Date: 06/09/2022 CLINICAL DATA:  Status post fall 5 days ago.  Cough for a few days EXAM: CHEST - 2 VIEW COMPARISON:  None Available. FINDINGS: Normal cardiomediastinal silhouette given patient rotation. Possible mild perihilar and peribronchial infiltrates. No focal consolidation, pleural effusion, or pneumothorax. No acute osseous abnormality. IMPRESSION: Question mild bronchitis/reactive airways.  No focal pneumonia. Electronically Signed   By: Placido Sou M.D.   On: 06/09/2022 19:30   MR BRAIN WO CONTRAST  Result Date: 06/09/2022 CLINICAL DATA:  Neuro deficit with acute stroke suspected EXAM: MRI HEAD WITHOUT CONTRAST TECHNIQUE: Multiplanar, multiecho pulse sequences of the brain and surrounding structures were obtained without intravenous contrast. COMPARISON:  Head  CT from earlier today.  11/09/2017 brain MRI FINDINGS: Brain: No acute infarction, hemorrhage, hydrocephalus, extra-axial collection or mass lesion. Generalized brain atrophy, advanced. Mild for age chronic small vessel ischemia in the cerebral white matter. Chronic lacune in the left thalamus. No chronic blood products. Vascular: Normal flow voids. Skull and upper cervical spine: Normal marrow signal. Sinuses/Orbits: Negative. IMPRESSION: 1. No acute or reversible finding. 2. Advanced brain atrophy that is progressed from 2019. Electronically Signed   By: Jorje Guild M.D.   On: 06/09/2022 07:14   CT HEAD WO CONTRAST  Result Date: 06/09/2022 CLINICAL DATA:  Neuro deficit, acute, stroke suspected EXAM: CT HEAD WITHOUT CONTRAST TECHNIQUE: Contiguous axial images were obtained from the base of the skull through the vertex without intravenous contrast. RADIATION DOSE REDUCTION: This exam was performed according to the departmental dose-optimization program which includes automated exposure control, adjustment of the mA and/or kV according to patient size and/or  use of iterative reconstruction technique. COMPARISON:  11/09/2017. FINDINGS: Brain: Normal anatomic configuration. Moderate parenchymal volume loss is commensurate with the patient's age. Mild periventricular white matter changes are present likely reflecting the sequela of small vessel ischemia. Remote lacunar infarct noted within the left thalamus. No abnormal intra or extra-axial mass lesion or fluid collection. No abnormal mass effect or midline shift. No evidence of acute intracranial hemorrhage or infarct. Mild ventriculomegaly is commensurate with the degree of parenchymal volume loss in likely reflects ex vacuo dilation. Cerebellum unremarkable. Vascular: No asymmetric hyperdense vasculature at the skull base. Skull: Intact Sinuses/Orbits: Paranasal sinuses are clear. Orbits are unremarkable. Other: Mastoid air cells and middle ear cavities are clear. IMPRESSION: 1. No acute intracranial hemorrhage or infarct. 2. Remote lacunar infarct within the left thalamus. 3. Mild senescent change. Electronically Signed   By: Fidela Salisbury M.D.   On: 06/09/2022 02:38    Disposition Plan & Communication  Patient status: Inpatient  Admitted From: Home Planned disposition location: Skilled nursing facility Anticipated discharge date: 12/26 pending dispo planning  Family Communication: none    Author: Richarda Osmond, DO [facility] 06/10/2022, 7:31 AM   Available by Epic secure chat 7AM-7PM. If 7PM-7AM, please contact night-coverage.  [facility] contact information found on [email or web address].

## 2022-06-11 DIAGNOSIS — N179 Acute kidney failure, unspecified: Secondary | ICD-10-CM | POA: Diagnosis not present

## 2022-06-11 DIAGNOSIS — J101 Influenza due to other identified influenza virus with other respiratory manifestations: Secondary | ICD-10-CM | POA: Diagnosis not present

## 2022-06-11 DIAGNOSIS — M6282 Rhabdomyolysis: Secondary | ICD-10-CM | POA: Diagnosis not present

## 2022-06-11 LAB — CBC
HCT: 33.6 % — ABNORMAL LOW (ref 39.0–52.0)
Hemoglobin: 11.3 g/dL — ABNORMAL LOW (ref 13.0–17.0)
MCH: 29.3 pg (ref 26.0–34.0)
MCHC: 33.6 g/dL (ref 30.0–36.0)
MCV: 87 fL (ref 80.0–100.0)
Platelets: 92 10*3/uL — ABNORMAL LOW (ref 150–400)
RBC: 3.86 MIL/uL — ABNORMAL LOW (ref 4.22–5.81)
RDW: 13.4 % (ref 11.5–15.5)
WBC: 4.8 10*3/uL (ref 4.0–10.5)
nRBC: 0 % (ref 0.0–0.2)

## 2022-06-11 LAB — BASIC METABOLIC PANEL
Anion gap: 4 — ABNORMAL LOW (ref 5–15)
BUN: 30 mg/dL — ABNORMAL HIGH (ref 8–23)
CO2: 27 mmol/L (ref 22–32)
Calcium: 8.5 mg/dL — ABNORMAL LOW (ref 8.9–10.3)
Chloride: 105 mmol/L (ref 98–111)
Creatinine, Ser: 0.93 mg/dL (ref 0.61–1.24)
GFR, Estimated: >60 mL/min (ref >60)
Glucose, Bld: 116 mg/dL — ABNORMAL HIGH (ref 70–99)
Potassium: 3.4 mmol/L — ABNORMAL LOW (ref 3.5–5.1)
Sodium: 136 mmol/L (ref 135–145)

## 2022-06-11 LAB — CK: Total CK: 488 U/L — ABNORMAL HIGH (ref 49–397)

## 2022-06-11 MED ORDER — POTASSIUM CHLORIDE CRYS ER 20 MEQ PO TBCR
40.0000 meq | EXTENDED_RELEASE_TABLET | Freq: Two times a day (BID) | ORAL | Status: AC
  Administered 2022-06-11 (×2): 40 meq via ORAL
  Filled 2022-06-11 (×2): qty 2

## 2022-06-11 NOTE — Progress Notes (Signed)
PROGRESS NOTE  Brendan Lin    DOB: 1932/03/23, 88 y.o.  VPX:[id number]    Code Status: Full Code   DOA: 06/09/2022   LOS: 2   Brief hospital course  Brendan Lin is a 88 y.o. male with a PMH significant for hypothyroidism, hyperlipidemia, prior stroke with residual left-sided weakness.   They presented from home where he lives alone to the ED on 06/09/2022 with down x 5 days. He denies fall, injury, or LOC. He lowered himself to the ground to look under bed and was unable to get back up. Remembers the whole incident and endorses feeling grateful to be alive.   In the ED, it was found that they had temperature was 98.1 F, respiratory rate 23/min, pulse 100 bpm, BP 128/85, O2 sat 97%. normal CBC except thrombocytopenia.  BMP was normal except for blood glucose of 124, BUN/creatinine was 60/1.60.  Total CK 3,511 > 1,639, AST 112, ALT 40.  Influenza A was positive.  Influenza B B, SARS coronavirus 2, and RSV were negative.  Significant findings included MRI brain without contrast showed no acute or reversible finding CT head without contrast showed no acute intracranial hemorrhage or infarct Right shoulder x-ray showed no acute fracture.  Superior subluxation of the humeral head suggestive of rotator cuff pathology, unchanged from 02/15/2022 Chest x-ray showed question mild bronchitis/reactive airways.  No focal pneumonia.  They were initially treated with IVF.   Patient was admitted to medicine service for further workup and management of mild rhabdomyolysis and weakness as outlined in detail below.  06/11/22 -stable, ready for discharge to SNF when bed available  Assessment & Plan  Principal Problem:   Rhabdomyolysis Active Problems:   Influenza A   AKI (acute kidney injury) ([place])   Left leg weakness   Thrombocytopenia (HCC)   Acquired hypothyroidism   Mixed hyperlipidemia   Acute kidney injury ([place])  Acute rhabdomyolysis- improving Total CK 3,511 > 1,639>1,176> 488 - discontinue  IV fluids - PT/OT, recommending SNF   Influenza A- stable ORA. Outside treatment window.  - Continue supportive measures - Continue duo nebs, Mucinex PRN - Continue incentive spirometry and flutter valve   Acute kidney injury- Cr 1.27 from baseline of 0.8. Now back to baseline - avoid nephrotoxic agents/dehydration/hypotension   Left leg weakness- residual from prior CVA. Denies acute injury. No acute findings on brain imaging.  - PT/OT   Restricted range of motion of right shoulder Continue PT/OT eval and treat    Thrombocytopenia- gradual decreasing 127>>92. possibly reactive Continue to monitor platelet levels   Acquired hypothyroidism - Continue Synthroid   Mixed hyperlipidemia - Continue Lipitor  Body mass index is 18.47 kg/m.  VTE ppx: enoxaparin (LOVENOX) injection 40 mg Start: 06/11/22 0800 SCDs Start: 06/09/22 2324  Diet:     Diet   Diet Heart Room service appropriate? Yes; Fluid consistency: Thin   Consultants: None   Subjective 06/11/22    Pt reports feeling improved. He is agreeable to rehab. Denies any SOB, CP. He feels bored and would like books to read.    Objective   Vitals:   06/10/22 1238 06/10/22 1537 06/10/22 2013 06/11/22 0534  BP: 113/79 129/75 131/78 101/68  Pulse: 79 76 78 73  Resp: '18 18 19 18  '$ Temp: 98.3 F (36.8 C) 98.2 F (36.8 C) 98 F (36.7 C) 98.1 F (36.7 C)  TempSrc: Oral Oral Oral Oral  SpO2: 97% 98% 97% 96%  Weight:      Height:  Intake/Output Summary (Last 24 hours) at 06/11/2022 0732 Last data filed at 06/11/2022 0500 Gross per 24 hour  Intake 387.27 ml  Output 400 ml  Net -12.73 ml    Filed Weights   06/10/22 0016 06/10/22 0648  Weight: 57.5 kg 55.1 kg     Physical Exam:  General: awake, alert, NAD HEENT: atraumatic, clear conjunctiva, anicteric sclera, MMM, hearing grossly normal Respiratory: normal respiratory effort. Cardiovascular: quick capillary refill, normal S1/S2, RRR, no  JVD Gastrointestinal: soft, NT, ND Nervous: A&O x3. no gross focal neurologic deficits, normal speech Extremities: frail, no edema. Moving all spontaneously. LE weakness, R worse than L Skin: dry, intact, normal temperature, normal color. No rashes, lesions or ulcers on exposed skin Psychiatry: normal mood, congruent affect  Labs   I have personally reviewed the following labs and imaging studies CBC    Component Value Date/Time   WBC 4.8 06/11/2022 0212   RBC 3.86 (L) 06/11/2022 0212   HGB 11.3 (L) 06/11/2022 0212   HCT 33.6 (L) 06/11/2022 0212   PLT 92 (L) 06/11/2022 0212   MCV 87.0 06/11/2022 0212   MCH 29.3 06/11/2022 0212   MCHC 33.6 06/11/2022 0212   RDW 13.4 06/11/2022 0212   LYMPHSABS 0.7 06/09/2022 1840   MONOABS 0.9 06/09/2022 1840   EOSABS 0.0 06/09/2022 1840   BASOSABS 0.0 06/09/2022 1840      Latest Ref Rng & Units 06/11/2022    2:12 AM 06/10/2022    2:06 AM 06/10/2022    2:05 AM  BMP  Glucose 70 - 99 mg/dL 116  87    BUN 8 - 23 mg/dL 30  47    Creatinine 0.61 - 1.24 mg/dL 0.93  1.27  1.27   Sodium 135 - 145 mmol/L 136  140    Potassium 3.5 - 5.1 mmol/L 3.4  3.5    Chloride 98 - 111 mmol/L 105  107    CO2 22 - 32 mmol/L 27  22    Calcium 8.9 - 10.3 mg/dL 8.5  8.7      DG Shoulder Right  Result Date: 06/09/2022 CLINICAL DATA:  Shoulder pain after fall EXAM: RIGHT SHOULDER - 2+ VIEW COMPARISON:  Radiographs 02/15/2022 FINDINGS: No acute fracture or dislocation. Slight superior subluxation of the humeral head in relation to the glenoid with narrowing of the subacromial space suggestive of rotator cuff pathology. IMPRESSION: No acute fracture. Superior subluxation of the humeral head suggestive of rotator cuff pathology, unchanged from 02/15/2022. Electronically Signed   By: Placido Sou M.D.   On: 06/09/2022 19:32   DG Chest 2 View  Result Date: 06/09/2022 CLINICAL DATA:  Status post fall 5 days ago.  Cough for a few days EXAM: CHEST - 2 VIEW COMPARISON:   None Available. FINDINGS: Normal cardiomediastinal silhouette given patient rotation. Possible mild perihilar and peribronchial infiltrates. No focal consolidation, pleural effusion, or pneumothorax. No acute osseous abnormality. IMPRESSION: Question mild bronchitis/reactive airways.  No focal pneumonia. Electronically Signed   By: Placido Sou M.D.   On: 06/09/2022 19:30    Disposition Plan & Communication  Patient status: Inpatient  Admitted From: Home Planned disposition location: Skilled nursing facility Anticipated discharge date: 12/26 pending dispo planning  Family Communication: none    Author: Richarda Osmond, DO [facility] 06/11/2022, 7:32 AM   Available by Epic secure chat 7AM-7PM. If 7PM-7AM, please contact night-coverage.  [facility] contact information found on [email or web address].

## 2022-06-12 DIAGNOSIS — J101 Influenza due to other identified influenza virus with other respiratory manifestations: Secondary | ICD-10-CM | POA: Diagnosis not present

## 2022-06-12 DIAGNOSIS — N179 Acute kidney failure, unspecified: Secondary | ICD-10-CM | POA: Diagnosis not present

## 2022-06-12 DIAGNOSIS — M6282 Rhabdomyolysis: Secondary | ICD-10-CM | POA: Diagnosis not present

## 2022-06-12 NOTE — TOC Initial Note (Addendum)
Transition of Care [facility]) - Initial/Assessment Note    Patient Details  Name: Brendan Lin MRN: [id number] Date of Birth: November 26, 1931  Transition of Care [facility]) CM/SW Contact:    Archie Endo, LCSW Phone Number: 06/12/2022, 10:41 AM  Clinical Narrative:                 10:40am: CSW spoke with patient at bedside to discuss discharge plan. Patient reports he lives alone and his daughter lives in [place]. Patient reports he has never been to SNF before but is agreeable to go for short term rehab. Patient reports he is fully independent at baseline. Patient reports he does not have a preference on facility.  CSW completed FL2 and faxed patient's clinical information out for review.  2pm: CSW returned to patient's bedside to present him with bed offer however he was asleep. CSW did not awaken patient.  Expected Discharge Plan: Skilled Nursing Facility Barriers to Discharge: SNF Pending bed offer, Insurance Authorization   Patient Goals and CMS Choice Patient states their goals for this hospitalization and ongoing recovery are:: Return home CMS Medicare.gov Compare Post Acute Care list provided to:: Patient Choice offered to / list presented to : Patient      Expected Discharge Plan and Services                                              Prior Living Arrangements/Services   Lives with:: Self Patient language and need for interpreter reviewed:: Yes Do you feel safe going back to the place where you live?: Yes      Need for Family Participation in Patient Care: No (Comment) Care giver support system in place?: Yes (comment)   Criminal Activity/Legal Involvement Pertinent to Current Situation/Hospitalization: No - Comment as needed  Activities of Daily Living Home Assistive Devices/Equipment: Cane (specify quad or straight), Walker (specify type) ADL Screening (condition at time of admission) Patient's cognitive ability adequate to safely complete daily  activities?: No Is the patient deaf or have difficulty hearing?: No Does the patient have difficulty seeing, even when wearing glasses/contacts?: Yes Does the patient have difficulty concentrating, remembering, or making decisions?: No Patient able to express need for assistance with ADLs?: Yes Does the patient have difficulty dressing or bathing?: No Independently performs ADLs?: No Communication: Independent Dressing (OT): Needs assistance Is this a change from baseline?: Change from baseline, expected to last <3days Grooming: Needs assistance Is this a change from baseline?: Change from baseline, expected to last <3 days Feeding: Independent Bathing: Needs assistance Toileting: Needs assistance Is this a change from baseline?: Change from baseline, expected to last <3 days In/Out Bed: Needs assistance Is this a change from baseline?: Change from baseline, expected to last <3 days Walks in Home: Independent with device (comment) Does the patient have difficulty walking or climbing stairs?: Yes Weakness of Legs: Left Weakness of Arms/Hands: Both  Permission Sought/Granted                  Emotional Assessment Appearance:: Appears stated age Attitude/Demeanor/Rapport: Ambitious, Charismatic Affect (typically observed): Accepting, Calm, Hopeful, Happy Orientation: : Oriented to Self, Oriented to Place, Oriented to  Time, Oriented to Situation Alcohol / Substance Use: Not Applicable Psych Involvement: No (comment)  Admission diagnosis:  Rhabdomyolysis [M62.82] Influenza A [J10.1] Acute kidney injury ([place]) [N17.9] Non-traumatic rhabdomyolysis [M62.82] Patient Active Problem List  Diagnosis Date Noted   Thrombocytopenia ([place]) 06/10/2022   Acquired hypothyroidism 06/10/2022   Mixed hyperlipidemia 06/10/2022   Acute kidney injury ([place]) 06/10/2022   Rhabdomyolysis 06/09/2022   Influenza A 06/09/2022   AKI (acute kidney injury) ([place]) 06/09/2022   Left leg weakness  06/09/2022   Acute thalamic infarction [facility])    CVA (cerebral vascular accident) ([place]) 11/09/2017   Malignant neoplasm of prostate ([place]) 03/23/2016   PCP:  Clinic, [place]:   [place], [place] - [place] [address] [place] [address] Phone: [phone] Fax: [phone]     Social Determinants of Health (SDOH) Social History: [place]: No Food Insecurity (06/10/2022)  Housing: Low Risk  (06/10/2022)  Transportation Needs: No Transportation Needs (06/10/2022)  Utilities: Not At Risk (06/10/2022)  Tobacco Use: Medium Risk (06/10/2022)   SDOH Interventions:     Readmission Risk Interventions     No data to display

## 2022-06-12 NOTE — Progress Notes (Signed)
PROGRESS NOTE  Brendan Lin    DOB: 1932/05/09, 88 y.o.  EYC:[id number]    Code Status: Full Code   DOA: 06/09/2022   LOS: 3   Brief hospital course  Brendan Lin is a 88 y.o. male with a PMH significant for hypothyroidism, hyperlipidemia, prior stroke with residual left-sided weakness.   They presented from home where he lives alone to the ED on 06/09/2022 with down x 5 days. He denies fall, injury, or LOC. He lowered himself to the ground to look under bed and was unable to get back up. Remembers the whole incident and endorses feeling grateful to be alive.   In the ED, it was found that they had temperature was 98.1 F, respiratory rate 23/min, pulse 100 bpm, BP 128/85, O2 sat 97%. normal CBC except thrombocytopenia.  BMP was normal except for blood glucose of 124, BUN/creatinine was 60/1.60.  Total CK 3,511 > 1,639, AST 112, ALT 40.  Influenza A was positive.  Influenza B B, SARS coronavirus 2, and RSV were negative.  Significant findings included MRI brain without contrast showed no acute or reversible finding CT head without contrast showed no acute intracranial hemorrhage or infarct Right shoulder x-ray showed no acute fracture.  Superior subluxation of the humeral head suggestive of rotator cuff pathology, unchanged from 02/15/2022 Chest x-ray showed question mild bronchitis/reactive airways.  No focal pneumonia.  They were initially treated with IVF.   Patient was admitted to medicine service for further workup and management of mild rhabdomyolysis and weakness as outlined in detail below.  06/12/22 -stable, ready for discharge to SNF when bed available  Assessment & Plan  Principal Problem:   Rhabdomyolysis Active Problems:   Influenza A   AKI (acute kidney injury) ([place])   Left leg weakness   Thrombocytopenia (HCC)   Acquired hypothyroidism   Mixed hyperlipidemia   Acute kidney injury ([place])  Acute rhabdomyolysis- improving Total CK 3,511 > 1,639>1,176> 488 - discontinue  IV fluids - PT/OT, recommending SNF   Influenza A- stable ORA. Outside treatment window.  - Continue supportive measures - Continue duo nebs, Mucinex PRN - Continue incentive spirometry and flutter valve   Acute kidney injury- Cr 1.27 from baseline of 0.8. Now back to baseline - avoid nephrotoxic agents/dehydration/hypotension   Left leg weakness- residual from prior CVA. Denies acute injury. No acute findings on brain imaging.  - PT/OT  Mild hypokalemia- K+ 3.4 yesterday and repleted - monitor and replete PRN   Restricted range of motion of right shoulder Continue PT/OT eval and treat    Thrombocytopenia- gradual decreasing 127>>92. possibly reactive Continue to monitor platelet levels   Acquired hypothyroidism - Continue Synthroid   Mixed hyperlipidemia - Continue Lipitor  Body mass index is 18.47 kg/m.  VTE ppx: enoxaparin (LOVENOX) injection 40 mg Start: 06/11/22 0800 SCDs Start: 06/09/22 2324  Diet:     Diet   Diet Heart Room service appropriate? Yes; Fluid consistency: Thin   Consultants: None   Subjective 06/12/22    Pt reports feeling well. He has no complaints. He is thankful for the book I brought him. Looking forward to going home again. Agreeable to SNF   Objective   Vitals:   06/11/22 0806 06/11/22 1622 06/11/22 2015 06/12/22 0513  BP: 119/67 (!) 152/85 127/76 (!) 140/83  Pulse: 69 72 86 70  Resp: '18 18 17 17  '$ Temp: 98.4 F (36.9 C) 98.6 F (37 C) 99 F (37.2 C) 98.1 F (36.7 C)  TempSrc: Oral Oral Oral  Oral  SpO2: 98% 99% 98% 99%  Weight:      Height:        Intake/Output Summary (Last 24 hours) at 06/12/2022 0716 Last data filed at 06/12/2022 0539 Gross per 24 hour  Intake 236 ml  Output 950 ml  Net -714 ml    Filed Weights   06/10/22 0016 06/10/22 0648  Weight: 57.5 kg 55.1 kg     Physical Exam:  General: awake, alert, NAD HEENT: atraumatic, clear conjunctiva, anicteric sclera, MMM, hearing grossly normal Respiratory:  normal respiratory effort. Cardiovascular: quick capillary refill, normal S1/S2, RRR, no JVD Gastrointestinal: soft, NT, ND Nervous: A&O x3. no gross focal neurologic deficits, normal speech Extremities: frail, no edema. Moving all spontaneously. LE weakness, R worse than L Skin: dry, intact, normal temperature, normal color. No rashes, lesions or ulcers on exposed skin Psychiatry: normal mood, congruent affect  Labs   I have personally reviewed the following labs and imaging studies CBC    Component Value Date/Time   WBC 4.8 06/11/2022 0212   RBC 3.86 (L) 06/11/2022 0212   HGB 11.3 (L) 06/11/2022 0212   HCT 33.6 (L) 06/11/2022 0212   PLT 92 (L) 06/11/2022 0212   MCV 87.0 06/11/2022 0212   MCH 29.3 06/11/2022 0212   MCHC 33.6 06/11/2022 0212   RDW 13.4 06/11/2022 0212   LYMPHSABS 0.7 06/09/2022 1840   MONOABS 0.9 06/09/2022 1840   EOSABS 0.0 06/09/2022 1840   BASOSABS 0.0 06/09/2022 1840      Latest Ref Rng & Units 06/11/2022    2:12 AM 06/10/2022    2:06 AM 06/10/2022    2:05 AM  BMP  Glucose 70 - 99 mg/dL 116  87    BUN 8 - 23 mg/dL 30  47    Creatinine 0.61 - 1.24 mg/dL 0.93  1.27  1.27   Sodium 135 - 145 mmol/L 136  140    Potassium 3.5 - 5.1 mmol/L 3.4  3.5    Chloride 98 - 111 mmol/L 105  107    CO2 22 - 32 mmol/L 27  22    Calcium 8.9 - 10.3 mg/dL 8.5  8.7     Disposition Plan & Communication  Patient status: Inpatient  Admitted From: Home Planned disposition location: Skilled nursing facility Anticipated discharge date: 12/27 pending dispo planning  Family Communication: none    Author: Richarda Osmond, DO [facility] 06/12/2022, 7:16 AM   Available by Epic secure chat 7AM-7PM. If 7PM-7AM, please contact night-coverage.  [facility] contact information found on [email or web address].

## 2022-06-12 NOTE — Evaluation (Signed)
Occupational Therapy Evaluation Patient Details Name: Brendan Lin MRN: [id number] DOB: October 14, 1931 Today's Date: 06/12/2022   History of Present Illness Brendan Lin is a 88 y.o. male with a PMH significant for hypothyroidism, hyperlipidemia, prior stroke with residual left-sided weakness. He was admitted after falling at home and unable to get up for 5 days.  Found to have Rhabdomyolysis and positive for Influenza A. Pain in R shoulder negative for frature, but superior subluxation unchanged from 02/15/2022.   Clinical Impression   PTA pt lives alone independently with assistance from daughter/family for IADL tasks and errands, although he normally takes the bus to the store. Pt demonstrates a decline in function, requiring min A for mobility and Mod A with LB ADL. Pt with apparent cognitive deficits as noted below, however unsure of baseline. Recommend rehab at SNF to maximize functional level of independence. Acute OT to follow.      Recommendations for follow up therapy are one component of a multi-disciplinary discharge planning process, led by the attending physician.  Recommendations may be updated based on patient status, additional functional criteria and insurance authorization.   Follow Up Recommendations  Skilled nursing-short term rehab (<3 hours/day)     Assistance Recommended at Discharge Frequent or constant Supervision/Assistance  Patient can return home with the following A little help with walking and/or transfers;A little help with bathing/dressing/bathroom;Assistance with cooking/housework;Direct supervision/assist for medications management;Direct supervision/assist for financial management;Assist for transportation;Help with stairs or ramp for entrance    Functional Status Assessment  Patient has had a recent decline in their functional status and demonstrates the ability to make significant improvements in function in a reasonable and predictable amount of time.   Equipment Recommendations  Tub/shower seat    Recommendations for Other Services       Precautions / Restrictions Precautions Precautions: Fall Restrictions Weight Bearing Restrictions: Yes      Mobility Bed Mobility               General bed mobility comments: OOB in chair    Transfers Overall transfer level: Needs assistance Equipment used: Rolling walker (2 wheels) Transfers: Sit to/from Stand Sit to Stand: Min guard                  Balance Overall balance assessment: Needs assistance, History of Falls   Sitting balance-Leahy Scale: Good     Standing balance support: No upper extremity supported Standing balance-Leahy Scale: Fair Standing balance comment: initial standing without UE support, needed walker for ambulation                           ADL either performed or assessed with clinical judgement   ADL Overall ADL's : Needs assistance/impaired     Grooming: Set up   Upper Body Bathing: Set up   Lower Body Bathing: Sit to/from stand;Moderate assistance   Upper Body Dressing : Minimal assistance   Lower Body Dressing: Moderate assistance;Sit to/from stand   Toilet Transfer: Minimal assistance;Ambulation   Toileting- Clothing Manipulation and Hygiene: Minimal assistance       Functional mobility during ADLs: Minimal assistance;Rolling walker (2 wheels) General ADL Comments: unable to complete figure four positioning     Vision Baseline Vision/History: 3 Glaucoma;2 Legally blind Additional Comments: legally blind L eye; has low vision R eye - wears glasses at all times     Perception     Praxis      Pertinent Vitals/Pain Pain Assessment Pain Assessment: Faces  Faces Pain Scale: Hurts a little bit Pain Location: R shoulder with movement (back) Pain Descriptors / Indicators: Discomfort Pain Intervention(s): Limited activity within patient's tolerance     Hand Dominance Right   Extremity/Trunk Assessment Upper  Extremity Assessment Upper Extremity Assessment: RUE deficits/detail RUE Deficits / Details: apparent RTC insufficiency; unable to lift shoulder - uses LUE to bring RUE up toward head - states "it's been like this for 3 months"; PROM limited to 90 FF and 45 ER; 60 Abd; elbow/wrist/hand WFL RUE Coordination: decreased gross motor   Lower Extremity Assessment Lower Extremity Assessment: Defer to PT evaluation   Cervical / Trunk Assessment Cervical / Trunk Assessment: Normal   Communication Communication Communication: No difficulties   Cognition Arousal/Alertness: Awake/alert Behavior During Therapy: WFL for tasks assessed/performed Overall Cognitive Status: No family/caregiver present to determine baseline cognitive functioning                                 General Comments: appropriate; screened with the Short Blessed Test - scored an  8 which places him in the "impaired category"     General Comments       Exercises     Shoulder Instructions      Home Living Family/patient expects to be discharged to:: Private residence Living Arrangements: Alone Available Help at Discharge: Family Type of Home: Apartment Home Access: Level entry     Home Layout: One level     Bathroom Shower/Tub: Teacher, early years/pre: Standard Bathroom Accessibility: Yes How Accessible: Accessible via walker Home Equipment: Rollator (4 wheels);Cane - single point          Prior Functioning/Environment Prior Level of Function : Needs assist             Mobility Comments: uses cane outside ADLs Comments: family/friends assist with groceries, he does not drive, takes bus to store        OT Problem List: Decreased strength;Decreased range of motion;Decreased activity tolerance;Impaired balance (sitting and/or standing);Decreased safety awareness;Decreased cognition;Pain;Impaired UE functional use      OT Treatment/Interventions: Self-care/ADL  training;Therapeutic exercise;DME and/or AE instruction;Therapeutic activities;Cognitive remediation/compensation;Patient/family education;Balance training    OT Goals(Current goals can be found in the care plan section) Acute Rehab OT Goals Patient Stated Goal: to get stronger and go back home OT Goal Formulation: With patient Time For Goal Achievement: 06/26/22 Potential to Achieve Goals: Good  OT Frequency: Min 2X/week    Co-evaluation              AM-PAC OT "6 Clicks" Daily Activity     Outcome Measure Help from another person eating meals?: None Help from another person taking care of personal grooming?: A Little Help from another person toileting, which includes using toliet, bedpan, or urinal?: A Little Help from another person bathing (including washing, rinsing, drying)?: A Lot Help from another person to put on and taking off regular upper body clothing?: A Little Help from another person to put on and taking off regular lower body clothing?: A Lot 6 Click Score: 17   End of Session Equipment Utilized During Treatment: Gait belt;Rolling walker (2 wheels) Nurse Communication: Mobility status  Activity Tolerance: Patient tolerated treatment well Patient left: in chair;with call bell/phone within reach  OT Visit Diagnosis: Unsteadiness on feet (R26.81);Other abnormalities of gait and mobility (R26.89);Muscle weakness (generalized) (M62.81);History of falling (Z91.81);Other symptoms and signs involving cognitive function;Pain Pain - Right/Left: Right Pain -  part of body: Shoulder (bck)                Time: 7353-2992 OT Time Calculation (min): 18 min Charges:  OT General Charges $OT Visit: 1 Visit OT Evaluation $OT Eval Moderate Complexity: [place], OT/L   Acute OT Clinical Specialist Acute Rehabilitation Services Pager [phone] Office [phone]   [facility] 06/12/2022, 9:50 AM

## 2022-06-12 NOTE — Progress Notes (Signed)
Physical Therapy Treatment Patient Details Name: Brendan Lin MRN: [id number] DOB: [birth year] Today's Date: 06/12/2022   History of Present Illness Brendan Lin is a 88 y.o. male with a PMH significant for hypothyroidism, prior stroke with residual left-sided weakness. He was admitted on 12/23  after falling at home and unable to get up for 5 days.  Found to have Rhabdomyolysis and positive for Influenza A. Pain in R shoulder negative for frature, but superior subluxation unchanged from 02/15/2022.    PT Comments    Patient progressing well towards PT goals. Session focused on progressive ambulation and overall mobility. Requires mod A for bed mobility with difficulty moving LLE and elevating trunk and Min guard assist for standing. Improved ambulation distance today with Min A and use of RW for support. Pain and stiffness in LLE present impacting mobility. Pt pleased with progress today. Pt is home alone and continues to be a fall risk. Encouraged increasing activity and mobility while in the hospital. Continue to recommend SNF. Will follow.   Recommendations for follow up therapy are one component of a multi-disciplinary discharge planning process, led by the attending physician.  Recommendations may be updated based on patient status, additional functional criteria and insurance authorization.  Follow Up Recommendations  Skilled nursing-short term rehab (<3 hours/day) Can patient physically be transported by private vehicle: Yes   Assistance Recommended at Discharge Intermittent Supervision/Assistance  Patient can return home with the following A little help with walking and/or transfers;A little help with bathing/dressing/bathroom;Assist for transportation   Equipment Recommendations  None recommended by PT    Recommendations for Other Services       Precautions / Restrictions Precautions Precautions: Fall Restrictions Weight Bearing Restrictions: No     Mobility  Bed  Mobility Overal bed mobility: Needs Assistance Bed Mobility: Supine to Sit     Supine to sit: Mod assist, HOB elevated     General bed mobility comments: Assist with LLE and trunk to get to EOB with increased time and use of rails.    Transfers Overall transfer level: Needs assistance Equipment used: Rolling walker (2 wheels) Transfers: Sit to/from Stand Sit to Stand: Min guard, From elevated surface           General transfer comment: Min guard for safety. Stood from [employer] with cues for hand placement/technique. Transferred to chair post ambulation.    Ambulation/Gait Ambulation/Gait assistance: Min assist Gait Distance (Feet): 120 Feet Assistive device: Rolling walker (2 wheels) Gait Pattern/deviations: Step-to pattern, Step-through pattern, Shuffle, Decreased stride length Gait velocity: decreased     General Gait Details: Slow, mildly unsteady gait with difficulty initiating LLE, cues for weight shifting towards right. LLE movement improved with increased distance.   Stairs             Wheelchair Mobility    Modified Rankin (Stroke Patients Only)       Balance Overall balance assessment: Needs assistance, History of Falls Sitting-balance support: Feet supported, No upper extremity supported Sitting balance-Leahy Scale: Good     Standing balance support: During functional activity Standing balance-Leahy Scale: Fair Standing balance comment: initial standing without UE support, needed walker for ambulation                            Cognition Arousal/Alertness: Awake/alert Behavior During Therapy: WFL for tasks assessed/performed Overall Cognitive Status: No family/caregiver present to determine baseline cognitive functioning  General Comments: Follows commands appropriately/pleasant.        Exercises      General Comments General comments (skin integrity, edema, etc.): VSS on RA.       Pertinent Vitals/Pain Pain Assessment Pain Assessment: Faces Faces Pain Scale: Hurts little more Pain Location: LLE with movement Pain Descriptors / Indicators: Discomfort, Sore Pain Intervention(s): Monitored during session, Limited activity within patient's tolerance    Home Living Family/patient expects to be discharged to:: Private residence Living Arrangements: Alone Available Help at Discharge: Family Type of Home: Apartment Home Access: Level entry       Home Layout: One level Home Equipment: Rollator (4 wheels);Cane - single point      Prior Function            PT Goals (current goals can now be found in the care plan section) Progress towards PT goals: Progressing toward goals    Frequency    Min 3X/week      PT Plan Current plan remains appropriate    Co-evaluation              AM-PAC PT "6 Clicks" Mobility   Outcome Measure  Help needed turning from your back to your side while in a flat bed without using bedrails?: A Little Help needed moving from lying on your back to sitting on the side of a flat bed without using bedrails?: A Lot Help needed moving to and from a bed to a chair (including a wheelchair)?: A Little Help needed standing up from a chair using your arms (e.g., wheelchair or bedside chair)?: A Little Help needed to walk in hospital room?: A Little Help needed climbing 3-5 steps with a railing? : Total 6 Click Score: 15    End of Session Equipment Utilized During Treatment: Gait belt Activity Tolerance: Patient tolerated treatment well Patient left: in chair;with call bell/phone within reach;with nursing/sitter in room Nurse Communication: Mobility status PT Visit Diagnosis: Other abnormalities of gait and mobility (R26.89);History of falling (Z91.81);Muscle weakness (generalized) (M62.81);Pain Pain - Right/Left: Left Pain - part of body: Leg     Time: 8250-0370 PT Time Calculation (min) (ACUTE ONLY): 30  min  Charges:  $Gait Training: 8-22 mins $Therapeutic Activity: 8-22 mins                     Marisa Severin, PT, DPT Acute Rehabilitation Services Secure chat preferred Office [place] 06/12/2022, 10:15 AM

## 2022-06-12 NOTE — NC FL2 (Signed)
[place] LEVEL OF CARE FORM     IDENTIFICATION  Patient Name: Brendan Lin Birthdate: 12/09/1931 Sex: male Admission Date (Current Location): 06/09/2022  [facility] and [place] Number:  Herbalist and Address:         Provider Number: [id number]  Attending Physician Name and Address:  Richarda Osmond, MD  Relative Name and Phone Number:       Current Level of Care:   Recommended Level of Care: [place] Prior Approval Number:    Date Approved/Denied:   PASRR Number: [id number]  Discharge Plan: SNF    Current Diagnoses: Patient Active Problem List   Diagnosis Date Noted   Thrombocytopenia ([place]) 06/10/2022   Acquired hypothyroidism 06/10/2022   Mixed hyperlipidemia 06/10/2022   Acute kidney injury ([place]) 06/10/2022   Rhabdomyolysis 06/09/2022   Influenza A 06/09/2022   AKI (acute kidney injury) ([place]) 06/09/2022   Left leg weakness 06/09/2022   Acute thalamic infarction [facility])    CVA (cerebral vascular accident) ([place]) 11/09/2017   Malignant neoplasm of prostate ([place]) 03/23/2016    Orientation RESPIRATION BLADDER Height & Weight     Time, Situation, Place, Self  Normal Continent Weight: 121 lb 7.6 oz (55.1 kg) Height:  '5\' 8"'$  (172.7 cm)  BEHAVIORAL SYMPTOMS/MOOD NEUROLOGICAL BOWEL NUTRITION STATUS      Continent Diet  AMBULATORY STATUS COMMUNICATION OF NEEDS Skin   Limited Assist Verbally Normal                       Personal Care Assistance Level of Assistance  Bathing, Feeding, Dressing Bathing Assistance: Limited assistance Feeding assistance: Independent Dressing Assistance: Limited assistance     Functional Limitations Info  Sight, Hearing, Speech Sight Info: Adequate Hearing Info: Adequate Speech Info: Adequate    SPECIAL CARE FACTORS FREQUENCY  PT (By licensed PT), OT (By licensed OT)     PT Frequency: 5x weekly OT Frequency: 5x weekly            Contractures Contractures Info: Not  present    Additional Factors Info  Code Status, Allergies Code Status Info: Full Allergies Info: Sulfa antibiotics           Current Medications (06/12/2022):  This is the current hospital active medication list Current Facility-Administered Medications  Medication Dose Route Frequency Provider Last Rate Last Admin   acetaminophen (TYLENOL) tablet 650 mg  650 mg Oral Q6H PRN Adefeso, Oladapo, DO       Or   acetaminophen (TYLENOL) suppository 650 mg  650 mg Rectal Q6H PRN Adefeso, Oladapo, DO       aspirin EC tablet 81 mg  81 mg Oral Daily Adefeso, Oladapo, DO   81 mg at 06/12/22 0907   atorvastatin (LIPITOR) tablet 40 mg  40 mg Oral q1800 Adefeso, Oladapo, DO   40 mg at 06/11/22 1833   dextromethorphan-guaiFENesin ([place] DM) 30-600 MG per 12 hr tablet 1 tablet  1 tablet Oral BID Adefeso, Oladapo, DO   1 tablet at 06/12/22 0907   enoxaparin (LOVENOX) injection 40 mg  40 mg Subcutaneous O12Y Jeneen Rinks, RPH       ipratropium-albuterol (DUONEB) 0.5-2.5 (3) MG/3ML nebulizer solution 3 mL  3 mL Nebulization Q4H PRN Adefeso, Oladapo, DO       levothyroxine (SYNTHROID) tablet 75 mcg  75 mcg Oral Q0600 Adefeso, Oladapo, DO   75 mcg at 06/12/22 0512   ondansetron (ZOFRAN) tablet 4 mg  4 mg Oral Q6H PRN Adefeso,  Oladapo, DO       Or   ondansetron (ZOFRAN) injection 4 mg  4 mg Intravenous Q6H PRN Adefeso, Oladapo, DO       polyethylene glycol (MIRALAX / GLYCOLAX) packet 17 g  17 g Oral BID Richarda Osmond, MD   17 g at 06/12/22 0907   sorbitol, milk of mag, mineral oil, glycerin (SMOG) enema  960 mL Rectal Once PRN Richarda Osmond, MD         Discharge Medications: Please see discharge summary for a list of discharge medications.  Relevant Imaging Results:  Relevant Lab Results:   Additional Information SSN: [id number]  Archie Endo, LCSW

## 2022-06-12 NOTE — Care Management Important Message (Signed)
Important Message  Patient Details  Name: Brendan Lin MRN: [id number] Date of Birth: 1931-12-15   Medicare Important Message Given:  Yes Patient has a contact precaution in place tried to call the patient at home and in room  no answer will mail the IM to the patient home Tomma Lightning 06/12/2022, 4:04 PM

## 2022-06-13 ENCOUNTER — Inpatient Hospital Stay (HOSPITAL_COMMUNITY): Payer: Medicare PPO

## 2022-06-13 DIAGNOSIS — E440 Moderate protein-calorie malnutrition: Secondary | ICD-10-CM | POA: Insufficient documentation

## 2022-06-13 DIAGNOSIS — M6282 Rhabdomyolysis: Secondary | ICD-10-CM | POA: Diagnosis not present

## 2022-06-13 LAB — BASIC METABOLIC PANEL
Anion gap: 6 (ref 5–15)
BUN: 14 mg/dL (ref 8–23)
CO2: 27 mmol/L (ref 22–32)
Calcium: 9 mg/dL (ref 8.9–10.3)
Chloride: 102 mmol/L (ref 98–111)
Creatinine, Ser: 0.73 mg/dL (ref 0.61–1.24)
GFR, Estimated: >60 mL/min (ref >60)
Glucose, Bld: 109 mg/dL — ABNORMAL HIGH (ref 70–99)
Potassium: 4.2 mmol/L (ref 3.5–5.1)
Sodium: 135 mmol/L (ref 135–145)

## 2022-06-13 LAB — CBC
HCT: 36 % — ABNORMAL LOW (ref 39.0–52.0)
Hemoglobin: 12.5 g/dL — ABNORMAL LOW (ref 13.0–17.0)
MCH: 29.8 pg (ref 26.0–34.0)
MCHC: 34.7 g/dL (ref 30.0–36.0)
MCV: 85.7 fL (ref 80.0–100.0)
Platelets: 141 10*3/uL — ABNORMAL LOW (ref 150–400)
RBC: 4.2 MIL/uL — ABNORMAL LOW (ref 4.22–5.81)
RDW: 13.2 % (ref 11.5–15.5)
WBC: 6.2 10*3/uL (ref 4.0–10.5)
nRBC: 0 % (ref 0.0–0.2)

## 2022-06-13 LAB — TSH: TSH: 6.965 u[IU]/mL — ABNORMAL HIGH (ref 0.350–4.500)

## 2022-06-13 LAB — SEDIMENTATION RATE: Sed Rate: 27 mm/hr — ABNORMAL HIGH (ref 0–16)

## 2022-06-13 LAB — FOLATE: Folate: 12.2 ng/mL (ref >5.9)

## 2022-06-13 LAB — VITAMIN B12: Vitamin B-12: 316 pg/mL (ref 180–914)

## 2022-06-13 LAB — T4, FREE: Free T4: 1.19 ng/dL — ABNORMAL HIGH (ref 0.61–1.12)

## 2022-06-13 LAB — C-REACTIVE PROTEIN: CRP: 2.5 mg/dL — ABNORMAL HIGH (ref <1.0)

## 2022-06-13 MED ORDER — ENSURE ENLIVE PO LIQD
237.0000 mL | Freq: Two times a day (BID) | ORAL | Status: DC
  Administered 2022-06-13 – 2022-06-14 (×3): 237 mL via ORAL

## 2022-06-13 MED ORDER — ADULT MULTIVITAMIN W/MINERALS CH
1.0000 | ORAL_TABLET | Freq: Every day | ORAL | Status: DC
  Administered 2022-06-13 – 2022-06-14 (×2): 1 via ORAL
  Filled 2022-06-13 (×2): qty 1

## 2022-06-13 NOTE — Care Management Obs Status (Cosign Needed)
[place] NOTIFICATION   Patient Details  Name: Brendan Lin MRN: [id number] Date of Birth: January 22, 1932   Medicare Observation Status Notification Given:  Yes    Curlene Labrum, RN 06/13/2022, 4:24 PM

## 2022-06-13 NOTE — TOC Progression Note (Signed)
Transition of Care [facility]) - Progression Note    Patient Details  Name: Brendan Lin MRN: [id number] Date of Birth: 1932-04-26  Transition of Care [facility]) CM/SW [place], RN Phone Number: 06/13/2022, 11:46 AM  Clinical Narrative:    CM met with the patient at the bedside and offered Medicare choice regarding SNF placement.  The patient accepted the bed offer at [facility].  I called and left a voicemail message with Star, admission Mudlogger at [facility].  Insurance authorization will be started through [facility] - pending insurance approval.  CM will continue to follow the patient for SNF placement - waiting for Insurance auth.   Expected Discharge Plan: [place] Barriers to Discharge: SNF Pending bed offer, Insurance Authorization  Expected Discharge Plan and Services                                               Social Determinants of Health (SDOH) Interventions SDOH Screenings   Food Insecurity: No Food Insecurity (06/10/2022)  Housing: Low Risk  (06/10/2022)  Transportation Needs: No Transportation Needs (06/10/2022)  Utilities: Not At Risk (06/10/2022)  Tobacco Use: Medium Risk (06/10/2022)    Readmission Risk Interventions     No data to display

## 2022-06-13 NOTE — Progress Notes (Addendum)
Initial Nutrition Assessment  DOCUMENTATION CODES:   Underweight, Non-severe (moderate) malnutrition in context of acute illness/injury  INTERVENTION:  - Add Ensure Enlive po BID, each supplement provides 350 kcal and 20 grams of protein.  - Add MVI q day.   - liberalize diet to Regular.   NUTRITION DIAGNOSIS:   Moderate Malnutrition related to acute illness as evidenced by mild fat depletion, mild muscle depletion.  GOAL:   Patient will meet greater than or equal to 90% of their needs  MONITOR:   Supplement acceptance, PO intake  REASON FOR ASSESSMENT:   Malnutrition Screening Tool    ASSESSMENT:   88 y.o. male admits related to left leg weakness. PMH includes: prostate cancer, stroke, thyroid disease. Pt is currently receiving medical management for acute rhabdomyolysis.  Meds reviewed: lipitor, miralax. Labs reviewed.   The pt reports that he was eating well prior to today. He states that he is having difficulty feeding himself. RD notified RN of difficulty feeding. RD will also add Ensure BID for now. Will continue to monitor PO intakes. No significant wt hx within a year time span over the past year.  NUTRITION - FOCUSED PHYSICAL EXAM:  Flowsheet Row Most Recent Value  Orbital Region Mild depletion  Upper Arm Region Mild depletion  Thoracic and Lumbar Region Unable to assess  Buccal Region Mild depletion  Temple Region Moderate depletion  Clavicle Bone Region Moderate depletion  Clavicle and Acromion Bone Region Mild depletion  Scapular Bone Region Unable to assess  Dorsal Hand Mild depletion  Patellar Region Mild depletion  Anterior Thigh Region Mild depletion  Posterior Calf Region Mild depletion  Edema (RD Assessment) None       Diet Order:   Diet Order             Diet Heart Room service appropriate? Yes; Fluid consistency: Thin  Diet effective now                   EDUCATION NEEDS:   Education needs have been addressed  Skin:  Skin  Assessment: Reviewed RN Assessment  Last BM:  06/12/22  Height:   Ht Readings from Last 1 Encounters:  06/10/22 '5\' 8"'$  (1.727 m)    Weight:   Wt Readings from Last 1 Encounters:  06/10/22 55.1 kg    Ideal Body Weight:     BMI:  Body mass index is 18.47 kg/m.  Estimated Nutritional Needs:   Kcal:  1650-1930 kcals  Protein:  80-95 gm  Fluid:  >/= 1.6 L  Thalia Bloodgood, RD, LDN, CNSC.

## 2022-06-13 NOTE — Care Management CC44 (Signed)
Condition Code 44 Documentation Completed  Patient Details  Name: Brendan Lin MRN: [id number] Date of Birth: 14-Jul-1931   Condition Code 44 given:  Yes Patient signature on Condition Code 44 notice:  Yes Documentation of 2 MD's agreement:  Yes Code 44 added to claim:  Yes    Curlene Labrum, RN 06/13/2022, 4:24 PM

## 2022-06-13 NOTE — Progress Notes (Signed)
PROGRESS NOTE  Ab Leaming    DOB: July 14, 1931, 88 y.o.  UMP:[id number]    Code Status: Full Code   DOA: 06/09/2022   LOS: 4   Brief hospital course  Lesley Galentine is a 88 y.o. male with a PMH significant for hypothyroidism, hyperlipidemia, prior stroke with residual left-sided weakness.   They presented from home where he lives alone to the ED on 06/09/2022 with down x 5 days. He denies fall, injury, or LOC. He lowered himself to the ground to look under bed and was unable to get back up. Remembers the whole incident and endorses feeling grateful to be alive.   In the ED, it was found that they had temperature was 98.1 F, respiratory rate 23/min, pulse 100 bpm, BP 128/85, O2 sat 97%. normal CBC except thrombocytopenia.  BMP was normal except for blood glucose of 124, BUN/creatinine was 60/1.60.  Total CK 3,511 > 1,639, AST 112, ALT 40.  Influenza A was positive.  Influenza B B, SARS coronavirus 2, and RSV were negative.  Significant findings included MRI brain without contrast showed no acute or reversible finding CT head without contrast showed no acute intracranial hemorrhage or infarct Right shoulder x-ray showed no acute fracture.  Superior subluxation of the humeral head suggestive of rotator cuff pathology, unchanged from 02/15/2022 Chest x-ray showed question mild bronchitis/reactive airways.  No focal pneumonia.  They were initially treated with IVF.   Patient was admitted to medicine service for further workup and management of mild rhabdomyolysis and weakness as outlined in detail below.  Assessment & Plan  Principal Problem:   Rhabdomyolysis Active Problems:   Influenza A   AKI (acute kidney injury) ([place])   Left leg weakness   Thrombocytopenia (HCC)   Acquired hypothyroidism   Mixed hyperlipidemia   Acute kidney injury ([place])   Malnutrition of moderate degree  Acute rhabdomyolysis- improving Total CK 3,511 > 1,639>1,176> 488 Treated with IV fluid.  Currently  improving.   Influenza A- stable ORA. Outside treatment window.  - Continue supportive measures - Continue duo nebs, Mucinex PRN - Continue incentive spirometry and flutter valve   Acute kidney injury- Cr 1.27 from baseline of 0.8. Now back to baseline - avoid nephrotoxic agents/dehydration/hypotension   Left leg weakness- residual from prior CVA. Denies acute injury. No acute findings on brain imaging.  Will get MRI lumbar spine. Will also check vitamin levels and other labs  Mild hypokalemia- K+ 3.4 yesterday and repleted - monitor and replete PRN   Restricted range of motion of right shoulder Continue PT/OT eval and treat    Thrombocytopenia- gradual decreasing 127>>92. possibly reactive Continue to monitor platelet levels   Acquired hypothyroidism - Continue Synthroid   Mixed hyperlipidemia - Continue Lipitor  Underweight.  Moderate protein malnutrition nutrition. Body mass index is 18.47 kg/m.  VTE ppx: enoxaparin (LOVENOX) injection 40 mg Start: 06/11/22 0800 SCDs Start: 06/09/22 2324  Diet:     Diet   Diet regular Room service appropriate? Yes with Assist; Fluid consistency: Thin   Consultants: None   Subjective 06/13/22   Unable to move his left knee.  No nausea no vomiting no fever no chills.  No other acute complaint.   Objective   Vitals:   06/12/22 1532 06/12/22 2014 06/13/22 0916 06/13/22 1726  BP: (!) 135/94 113/70 110/73 97/63  Pulse: 74 95 72 89  Resp: '18 17 18 18  '$ Temp: 98.2 F (36.8 C) 98.2 F (36.8 C) 98.4 F (36.9 C) 98.3 F (36.8 C)  TempSrc: Oral Oral Oral Oral  SpO2: 99% 98% 98% 96%  Weight:      Height:        Intake/Output Summary (Last 24 hours) at 06/13/2022 1833 Last data filed at 06/13/2022 1053 Gross per 24 hour  Intake 250 ml  Output 950 ml  Net -700 ml    Filed Weights   06/10/22 0016 06/10/22 0648  Weight: 57.5 kg 55.1 kg     Physical Exam:  S1-S2 present. Bilateral crackles present. Bowel sound  present. No asterixis.  No pronator Drift.  Finger-nose-finger normal.  Sensation equal bilaterally.  Unable to move proximal left thigh.  Labs   I have personally reviewed the following labs and imaging studies CBC    Component Value Date/Time   WBC 6.2 06/13/2022 0244   RBC 4.20 (L) 06/13/2022 0244   HGB 12.5 (L) 06/13/2022 0244   HCT 36.0 (L) 06/13/2022 0244   PLT 141 (L) 06/13/2022 0244   MCV 85.7 06/13/2022 0244   MCH 29.8 06/13/2022 0244   MCHC 34.7 06/13/2022 0244   RDW 13.2 06/13/2022 0244   LYMPHSABS 0.7 06/09/2022 1840   MONOABS 0.9 06/09/2022 1840   EOSABS 0.0 06/09/2022 1840   BASOSABS 0.0 06/09/2022 1840      Latest Ref Rng & Units 06/13/2022    2:44 AM 06/11/2022    2:12 AM 06/10/2022    2:06 AM  BMP  Glucose 70 - 99 mg/dL 109  116  87   BUN 8 - 23 mg/dL 14  30  47   Creatinine 0.61 - 1.24 mg/dL 0.73  0.93  1.27   Sodium 135 - 145 mmol/L 135  136  140   Potassium 3.5 - 5.1 mmol/L 4.2  3.4  3.5   Chloride 98 - 111 mmol/L 102  105  107   CO2 22 - 32 mmol/L '27  27  22   '$ Calcium 8.9 - 10.3 mg/dL 9.0  8.5  8.7    Disposition Plan & Communication  Patient status: Observation switch to observation for now. Admitted From: Home Planned disposition location: Skilled nursing facility Family Communication: none   Author: Berle Mull, MD [facility] 06/13/2022, 6:33 PM

## 2022-06-14 DIAGNOSIS — M6282 Rhabdomyolysis: Secondary | ICD-10-CM | POA: Diagnosis not present

## 2022-06-14 MED ORDER — FOLIC ACID 1 MG PO TABS: 1.0000 mg | ORAL_TABLET | Freq: Every day | ORAL | 0 refills | Status: AC

## 2022-06-14 MED ORDER — ENSURE ENLIVE PO LIQD: 237.0000 mL | Freq: Two times a day (BID) | ORAL | 0 refills | Status: AC

## 2022-06-14 MED ORDER — POLYETHYLENE GLYCOL 3350 17 G PO PACK: 17.0000 g | PACK | Freq: Every day | ORAL | 0 refills | Status: AC

## 2022-06-14 MED ORDER — ADULT MULTIVITAMIN W/MINERALS CH: 1.0000 | ORAL_TABLET | Freq: Every day | ORAL | 0 refills | Status: AC

## 2022-06-14 MED ORDER — DM-GUAIFENESIN ER 30-600 MG PO TB12: 1.0000 | ORAL_TABLET | Freq: Two times a day (BID) | ORAL | 0 refills | Status: AC

## 2022-06-14 MED ORDER — FOLIC ACID 1 MG PO TABS
1.0000 mg | ORAL_TABLET | Freq: Every day | ORAL | Status: DC
  Administered 2022-06-14: 1 mg via ORAL
  Filled 2022-06-14: qty 1

## 2022-06-14 MED ORDER — ALBUTEROL SULFATE HFA 108 (90 BASE) MCG/ACT IN AERS: 2.0000 | INHALATION_SPRAY | Freq: Four times a day (QID) | RESPIRATORY_TRACT | 0 refills | Status: AC | PRN

## 2022-06-14 MED ORDER — ATORVASTATIN CALCIUM 10 MG PO TABS: 10.0000 mg | ORAL_TABLET | Freq: Every day | ORAL | 0 refills | Status: AC

## 2022-06-14 MED ORDER — CYANOCOBALAMIN 1000 MCG/ML IJ SOLN
1000.0000 ug | Freq: Once | INTRAMUSCULAR | Status: AC
  Administered 2022-06-14: 1000 ug via SUBCUTANEOUS
  Filled 2022-06-14: qty 1

## 2022-06-14 NOTE — Discharge Summary (Signed)
Physician Discharge Summary   Patient: Brendan Lin MRN: [id number] DOB: Oct 14, 1931  Admit date:     06/09/2022  Discharge date: 06/14/22  Discharge Physician: Berle Mull  PCP: [facility]  Recommendations at discharge:  Follow up with PCP in 1 week   Contact information for follow-up providers     [facility], [place]. Schedule an appointment as soon as possible for a visit in 1 week(s).   Contact information: [place] [address] [phone]              Contact information for after-discharge care     Destination     [facility] Preferred SNF .   Service: Skilled Nursing Contact information: [place] [phone]                    Discharge Diagnoses: Principal Problem:   Rhabdomyolysis Active Problems:   Influenza A   AKI (acute kidney injury) ([place])   Left leg weakness   Thrombocytopenia (HCC)   Acquired hypothyroidism   Mixed hyperlipidemia   Acute kidney injury ([place])   Malnutrition of moderate degree  Assessment and Plan  Acute rhabdomyolysis Nontraumatic  At home lowered himself to the ground to look under the bed and was unable to get back up and remained on the floor for 5 days. Admitted for acute rhabdomyolysis with peak CK of 3511  Improving Total CK 3,511 > 1,639>1,176> 488 Treated with IV fluid.  Delay start of lipitor   Influenza A-  stable. Outside treatment window.  Continue supportive measures Continue albuterol, Mucinex PRN Continue incentive spirometry and flutter valve   Acute kidney injury- Cr 1.27 from baseline of 0.8. Now back to baseline avoid nephrotoxic agents/dehydration/hypotension   Left leg weakness-  Residual from prior CVA. Denies acute injury. No acute findings on brain imaging.  MRI lumbar spine unremarkable B12 low will replace with MVI and folic acid   Mild hypokalemia-  Repleted   Acute  Thrombocytopenia-  gradual decreasing 127>>92. possibly reactive Now better, resume plavix.    Acquired hypothyroidism Continue Synthroid   Mixed hyperlipidemia Continue Lipitor, dose lowered. Reportedly not taking at home.    Underweight.  Moderate protein malnutrition nutrition. Body mass index is 18.47 kg/m.  Continue supplements   Consultants:  none  Procedures performed:  none  DISCHARGE MEDICATION: Allergies as of 06/14/2022       Reactions   Sulfa Antibiotics Rash        Medication List     TAKE these medications    albuterol 108 (90 Base) MCG/ACT inhaler Commonly known as: VENTOLIN HFA Inhale 2 puffs into the lungs every 6 (six) hours as needed for wheezing or shortness of breath.   aspirin EC 81 MG tablet Take 1 tablet (81 mg total) by mouth daily.   atorvastatin 10 MG tablet Commonly known as: LIPITOR Take 1 tablet (10 mg total) by mouth daily at 6 PM. Start taking on: June 21, 2022 What changed:  medication strength how much to take These instructions start on June 21, 2022. If you are unsure what to do until then, ask your doctor or other care provider.   clopidogrel 75 MG tablet Commonly known as: PLAVIX Take 75 mg by mouth daily.   dextromethorphan-guaiFENesin 30-600 MG 12hr tablet Commonly known as: MUCINEX DM Take 1 tablet by mouth 2 (two) times daily for 7 days.   feeding supplement Liqd Take 237 mLs by mouth 2 (two)  times daily between meals.   folic acid 1 MG tablet Commonly known as: FOLVITE Take 1 tablet (1 mg total) by mouth daily. Start taking on: June 15, 2022   levothyroxine 75 MCG tablet Commonly known as: SYNTHROID Take 75 mcg by mouth daily before breakfast.   multivitamin with minerals Tabs tablet Take 1 tablet by mouth daily. Start taking on: June 15, 2022   Netarsudil-Latanoprost 0.02-0.005 % Soln Place 1 drop into both eyes at bedtime.   polyethylene glycol 17 g packet Commonly known as: MIRALAX  / GLYCOLAX Take 17 g by mouth daily.   SIMBRINZA OP Place 1 drop into both eyes 3 (three) times daily.   timolol 0.5 % ophthalmic gel-forming Commonly known as: TIMOPTIC-XR Place 1 drop into both eyes daily.   vitamin D3 50 MCG (2000 UT) Caps Take 2,000 Units by mouth daily.       Disposition: SNF Diet recommendation: Regular diet  Discharge Exam: Vitals:   06/13/22 1726 06/13/22 2047 06/14/22 0445 06/14/22 0738  BP: 97/63 99/61 108/63 92/72  Pulse: 89 99 78 85  Resp: '18 17 17   '$ Temp: 98.3 F (36.8 C) 98.3 F (36.8 C) 98.4 F (36.9 C) 98.1 F (36.7 C)  TempSrc: Oral Oral  Oral  SpO2: 96% 99% 97% 97%  Weight:      Height:       General: Appear in mild distress; no visible Abnormal Neck Mass Or lumps, Conjunctiva normal Cardiovascular: S1 and S2 Present, no Murmur, Respiratory: good respiratory effort, Bilateral Air entry present and no Crackles, Occasional  wheezes Abdomen: Bowel Sound present, Non tender  Extremities: no Pedal edema Neurology: alert and oriented to time, place, and person  Filed Weights   06/10/22 0016 06/10/22 0648  Weight: 57.5 kg 55.1 kg   Condition at discharge: stable  The results of significant diagnostics from this hospitalization (including imaging, microbiology, ancillary and laboratory) are listed below for reference.   Imaging Studies: MR LUMBAR SPINE WO CONTRAST  Result Date: 06/13/2022 CLINICAL DATA:  Acute lumbar myelopathy EXAM: MRI LUMBAR SPINE WITHOUT CONTRAST TECHNIQUE: Multiplanar, multisequence MR imaging of the lumbar spine was performed. No intravenous contrast was administered. COMPARISON:  None Available. FINDINGS: Segmentation:  Normal segmentation.  Lowest disc space L5-S1 Alignment:  Mild anterolisthesis L5-S1.  Otherwise normal alignment Vertebrae:  Negative for fracture or mass Conus medullaris and cauda equina: Conus extends to the L1-2 level. Conus and cauda equina appear normal. Paraspinal and other soft tissues:  Negative for paraspinous mass or adenopathy. Disc levels: L1-2: Negative L2-3: Disc degeneration with disc space narrowing and diffuse endplate spurring. Mild facet degeneration. Negative for stenosis L3-4: Moderate to advanced disc degeneration with disc space narrowing and diffuse endplate spurring. Mild facet degeneration. Mild to moderate central canal stenosis. Moderate subarticular stenosis bilaterally due to spurring L4-5: Mild disc and facet degeneration.  Negative for stenosis L5-S1: Shallow central disc protrusion and moderate to advanced facet degeneration. No significant neural impingement. Image quality degraded by motion. IMPRESSION: 1. Mild to moderate central canal stenosis L3-4 with moderate subarticular stenosis bilaterally due to spurring. 2. Shallow central disc protrusion L5-S1 with moderate to advanced facet degeneration. No significant neural impingement. Electronically Signed   By: Franchot Gallo M.D.   On: 06/13/2022 16:47   DG Shoulder Right  Result Date: 06/09/2022 CLINICAL DATA:  Shoulder pain after fall EXAM: RIGHT SHOULDER - 2+ VIEW COMPARISON:  Radiographs 02/15/2022 FINDINGS: No acute fracture or dislocation. Slight superior subluxation of the humeral head in  relation to the glenoid with narrowing of the subacromial space suggestive of rotator cuff pathology. IMPRESSION: No acute fracture. Superior subluxation of the humeral head suggestive of rotator cuff pathology, unchanged from 02/15/2022. Electronically Signed   By: Placido Sou M.D.   On: 06/09/2022 19:32   DG Chest 2 View  Result Date: 06/09/2022 CLINICAL DATA:  Status post fall 5 days ago.  Cough for a few days EXAM: CHEST - 2 VIEW COMPARISON:  None Available. FINDINGS: Normal cardiomediastinal silhouette given patient rotation. Possible mild perihilar and peribronchial infiltrates. No focal consolidation, pleural effusion, or pneumothorax. No acute osseous abnormality. IMPRESSION: Question mild  bronchitis/reactive airways.  No focal pneumonia. Electronically Signed   By: Placido Sou M.D.   On: 06/09/2022 19:30   MR BRAIN WO CONTRAST  Result Date: 06/09/2022 CLINICAL DATA:  Neuro deficit with acute stroke suspected EXAM: MRI HEAD WITHOUT CONTRAST TECHNIQUE: Multiplanar, multiecho pulse sequences of the brain and surrounding structures were obtained without intravenous contrast. COMPARISON:  Head CT from earlier today.  11/09/2017 brain MRI FINDINGS: Brain: No acute infarction, hemorrhage, hydrocephalus, extra-axial collection or mass lesion. Generalized brain atrophy, advanced. Mild for age chronic small vessel ischemia in the cerebral white matter. Chronic lacune in the left thalamus. No chronic blood products. Vascular: Normal flow voids. Skull and upper cervical spine: Normal marrow signal. Sinuses/Orbits: Negative. IMPRESSION: 1. No acute or reversible finding. 2. Advanced brain atrophy that is progressed from 2019. Electronically Signed   By: Jorje Guild M.D.   On: 06/09/2022 07:14   CT HEAD WO CONTRAST  Result Date: 06/09/2022 CLINICAL DATA:  Neuro deficit, acute, stroke suspected EXAM: CT HEAD WITHOUT CONTRAST TECHNIQUE: Contiguous axial images were obtained from the base of the skull through the vertex without intravenous contrast. RADIATION DOSE REDUCTION: This exam was performed according to the departmental dose-optimization program which includes automated exposure control, adjustment of the mA and/or kV according to patient size and/or use of iterative reconstruction technique. COMPARISON:  11/09/2017. FINDINGS: Brain: Normal anatomic configuration. Moderate parenchymal volume loss is commensurate with the patient's age. Mild periventricular white matter changes are present likely reflecting the sequela of small vessel ischemia. Remote lacunar infarct noted within the left thalamus. No abnormal intra or extra-axial mass lesion or fluid collection. No abnormal mass effect or  midline shift. No evidence of acute intracranial hemorrhage or infarct. Mild ventriculomegaly is commensurate with the degree of parenchymal volume loss in likely reflects ex vacuo dilation. Cerebellum unremarkable. Vascular: No asymmetric hyperdense vasculature at the skull base. Skull: Intact Sinuses/Orbits: Paranasal sinuses are clear. Orbits are unremarkable. Other: Mastoid air cells and middle ear cavities are clear. IMPRESSION: 1. No acute intracranial hemorrhage or infarct. 2. Remote lacunar infarct within the left thalamus. 3. Mild senescent change. Electronically Signed   By: Fidela Salisbury M.D.   On: 06/09/2022 02:38    Microbiology: Results for orders placed or performed during the hospital encounter of 06/09/22  Resp panel by RT-PCR (RSV, Flu A&B, Covid) Anterior Nasal Swab     Status: Abnormal   Collection Time: 06/09/22  6:35 PM   Specimen: Anterior Nasal Swab  Result Value Ref Range Status   SARS Coronavirus 2 by RT PCR NEGATIVE NEGATIVE Final    Comment: (NOTE) SARS-CoV-2 target nucleic acids are NOT DETECTED.  The SARS-CoV-2 RNA is generally detectable in upper respiratory specimens during the acute phase of infection. The lowest concentration of SARS-CoV-2 viral copies this assay can detect is 138 copies/mL. A negative result does not preclude  SARS-Cov-2 infection and should not be used as the sole basis for treatment or other patient management decisions. A negative result may occur with  improper specimen collection/handling, submission of specimen other than nasopharyngeal swab, presence of viral mutation(s) within the areas targeted by this assay, and inadequate number of viral copies(<138 copies/mL). A negative result must be combined with clinical observations, patient history, and epidemiological information. The expected result is Negative.  Fact Sheet for Patients:  EntrepreneurPulse.com.au  Fact Sheet for Healthcare Providers:   IncredibleEmployment.be  This test is no t yet approved or cleared by the [place] FDA and  has been authorized for detection and/or diagnosis of SARS-CoV-2 by FDA under an Emergency Use Authorization (EUA). This EUA will remain  in effect (meaning this test can be used) for the duration of the COVID-19 declaration under Section 564(b)(1) of the Act, 21 U.S.C.section 360bbb-3(b)(1), unless the authorization is terminated  or revoked sooner.       Influenza A by PCR POSITIVE (A) NEGATIVE Final   Influenza B by PCR NEGATIVE NEGATIVE Final    Comment: (NOTE) The Xpert Xpress SARS-CoV-2/FLU/RSV plus assay is intended as an aid in the diagnosis of influenza from Nasopharyngeal swab specimens and should not be used as a sole basis for treatment. Nasal washings and aspirates are unacceptable for Xpert Xpress SARS-CoV-2/FLU/RSV testing.  Fact Sheet for Patients: EntrepreneurPulse.com.au  Fact Sheet for Healthcare Providers: IncredibleEmployment.be  This test is not yet approved or cleared by the [place] FDA and has been authorized for detection and/or diagnosis of SARS-CoV-2 by FDA under an Emergency Use Authorization (EUA). This EUA will remain in effect (meaning this test can be used) for the duration of the COVID-19 declaration under Section 564(b)(1) of the Act, 21 U.S.C. section 360bbb-3(b)(1), unless the authorization is terminated or revoked.     Resp Syncytial Virus by PCR NEGATIVE NEGATIVE Final    Comment: (NOTE) Fact Sheet for Patients: EntrepreneurPulse.com.au  Fact Sheet for Healthcare Providers: IncredibleEmployment.be  This test is not yet approved or cleared by the [place] FDA and has been authorized for detection and/or diagnosis of SARS-CoV-2 by FDA under an Emergency Use Authorization (EUA). This EUA will remain in effect (meaning this test can be used)  for the duration of the COVID-19 declaration under Section 564(b)(1) of the Act, 21 U.S.C. section 360bbb-3(b)(1), unless the authorization is terminated or revoked.  Performed at [facility], [place] [address] [place], [place] [address]    Labs: CBC: Recent Labs  Lab 06/09/22 0121 06/09/22 0138 06/09/22 1840 06/10/22 0205 06/10/22 0206 06/11/22 0212 06/13/22 0244  WBC 8.9  --  6.2 5.7 5.6 4.8 6.2  NEUTROABS 7.5  --  4.6  --   --   --   --   HGB 15.0   < > 13.5 12.5* 12.5* 11.3* 12.5*  HCT 44.5   < > 40.7 35.5* 36.6* 33.6* 36.0*  MCV 86.9  --  87.0 84.3 85.7 87.0 85.7  PLT 152  --  127* 108* 107* 92* 141*   < > = values in this interval not displayed.   Basic Metabolic Panel: Recent Labs  Lab 06/09/22 0121 06/09/22 0138 06/09/22 1840 06/10/22 0205 06/10/22 0206 06/11/22 0212 06/13/22 0244  NA 139 139 139  --  140 136 135  K 4.2 4.0 4.2  --  3.5 3.4* 4.2  CL 104 107 106  --  107 105 102  CO2 17*  --  23  --  22  27 27  GLUCOSE 75 73 121*  --  87 116* 109*  BUN 50* 44* 60*  --  47* 30* 14  CREATININE 1.38* 1.10 1.60* 1.27* 1.27* 0.93 0.73  CALCIUM 9.4  --  9.1  --  8.7* 8.5* 9.0  MG  --   --   --   --  2.4  --   --   PHOS  --   --   --   --  2.2*  --   --    Liver Function Tests: Recent Labs  Lab 06/09/22 0121 06/09/22 1840 06/10/22 0206  AST 159* 112* 95*  ALT 45* 40 35  ALKPHOS 44 36* 32*  BILITOT 1.1 1.0 1.1  PROT 6.9 6.3* 5.5*  ALBUMIN 4.0 3.6 3.2*   CBG: No results for input(s): "GLUCAP" in the last 168 hours.  Discharge time spent: greater than 30 minutes.  Signed: Berle Mull, MD [facility]

## 2022-06-14 NOTE — Plan of Care (Signed)

## 2022-06-14 NOTE — Progress Notes (Signed)
Patient is ready for discharge AVS is packet and IV is removed at this time. Report has been called to the [facility] and report was Given to [place].

## 2022-06-14 NOTE — TOC Transition Note (Signed)
Transition of Care [facility]) - CM/SW Discharge Note   Patient Details  Name: Brendan Lin MRN: [id number] Date of Birth: July 13, 1931  [facility]) CM/SW Contact:  Curlene Labrum, RN Phone Number: 06/14/2022, 12:01 PM   Clinical Narrative:    Insurance authorization was approved through [place] # [id number],  [place] ID [id number]  approved for placement at [facility] from 06/14/2022 through next review date 06/19/2022.  I called admissions at [facility] place and discharge summary and orders were uploaded in the hub for the facility.  PTAR will be arranged once [place] place provides Room number.  Bedside nursing - please call nursing report to [facility] at [phone].   Final next level of care: Skilled Nursing Facility Barriers to Discharge: SNF Pending bed offer, Insurance Authorization   Patient Goals and CMS Choice CMS Medicare.gov Compare Post Acute Care list provided to:: Patient Choice offered to / list presented to : Patient  Discharge Placement                         Discharge Plan and Services Additional resources added to the After Visit Summary for                                       Social Determinants of Health (SDOH) Interventions SDOH Screenings   Food Insecurity: No Food Insecurity (06/10/2022)  Housing: Low Risk  (06/10/2022)  Transportation Needs: No Transportation Needs (06/10/2022)  Utilities: Not At Risk (06/10/2022)  Tobacco Use: Medium Risk (06/10/2022)     Readmission Risk Interventions     No data to display

## 2022-06-14 NOTE — Progress Notes (Signed)
Physical Therapy Treatment Patient Details Name: Brendan Lin MRN: [id number] DOB: 1932-04-10 Today's Date: 06/14/2022   History of Present Illness Brendan Lin is a 88 y.o. male with a PMH significant for hypothyroidism, prior stroke with residual left-sided weakness. He was admitted on 12/23  after falling at home and unable to get up for 5 days.  Found to have Rhabdomyolysis and positive for Influenza A. Pain in R shoulder negative for frature, but superior subluxation unchanged from 02/15/2022.    PT Comments    Session focused on mobility, which then was limited to in the room due to breakfast arrival and pt wanting to eat. Continues to require moderate cues for bed mobility with min physical assist to achieve sitting EOB. Continues with difficulty moving LLE which pt reports is new in past 6 days. Noted MRI brain and lumbar spine negative. Min cues for hand placement during transfer from EOB to RW with minguard physical assist. Ambulated with RW with difficulty advancing LLE and cues for sequencing to incr success with advancing LLE a full step. Patient set up in chair with breakfast at end of session.    Recommendations for follow up therapy are one component of a multi-disciplinary discharge planning process, led by the attending physician.  Recommendations may be updated based on patient status, additional functional criteria and insurance authorization.  Follow Up Recommendations  Skilled nursing-short term rehab (<3 hours/day) Can patient physically be transported by private vehicle: Yes   Assistance Recommended at Discharge Intermittent Supervision/Assistance  Patient can return home with the following A little help with walking and/or transfers;A little help with bathing/dressing/bathroom;Assist for transportation;Assistance with cooking/housework;Help with stairs or ramp for entrance   Equipment Recommendations  None recommended by PT    Recommendations for Other Services        Precautions / Restrictions Precautions Precautions: Fall Restrictions Weight Bearing Restrictions: No     Mobility  Bed Mobility Overal bed mobility: Needs Assistance Bed Mobility: Rolling, Sidelying to Sit Rolling: Supervision Sidelying to sit: Min assist (with rail)       General bed mobility comments: pt initially attempting supine to sit with inability to lift head/shoulders off the bed; cued for rolling and side to sit with much better success    Transfers Overall transfer level: Needs assistance Equipment used: Rolling walker (2 wheels) Transfers: Sit to/from Stand Sit to Stand: Min guard           General transfer comment: Min guard for safety. Stood from [employer] with cues for hand placement/technique. Transferred to chair post ambulation.    Ambulation/Gait Ambulation/Gait assistance: Min assist Gait Distance (Feet): 15 Feet Assistive device: Rolling walker (2 wheels) Gait Pattern/deviations: Step-to pattern, Step-through pattern, Shuffle, Decreased stride length Gait velocity: decreased Gait velocity interpretation: <1.8 ft/sec, indicate of risk for recurrent falls   General Gait Details: Slow, mildly unsteady gait with difficulty initiating LLE, cues for weight shifting towards right.   Stairs             Wheelchair Mobility    Modified Rankin (Stroke Patients Only)       Balance Overall balance assessment: Needs assistance, History of Falls Sitting-balance support: Feet supported, No upper extremity supported Sitting balance-Leahy Scale: Good     Standing balance support: During functional activity Standing balance-Leahy Scale: Fair Standing balance comment: initial standing without UE support, needed walker for ambulation  Cognition Arousal/Alertness: Awake/alert Behavior During Therapy: WFL for tasks assessed/performed Overall Cognitive Status: No family/caregiver present to determine  baseline cognitive functioning                                 General Comments: Follows commands appropriately/pleasant.        Exercises      General Comments        Pertinent Vitals/Pain Pain Assessment Pain Assessment: Faces Faces Pain Scale: Hurts a little bit Pain Location: LLE with movement Pain Descriptors / Indicators: Discomfort, Sore Pain Intervention(s): Limited activity within patient's tolerance, Monitored during session    Home Living Family/patient expects to be discharged to:: Private residence Living Arrangements: Alone Available Help at Discharge: Family Type of Home: Apartment Home Access: Level entry       Home Layout: One level Home Equipment: Rollator (4 wheels);Cane - single point      Prior Function            PT Goals (current goals can now be found in the care plan section) Acute Rehab PT Goals Patient Stated Goal: to return home Time For Goal Achievement: 06/24/22 Potential to Achieve Goals: Good Progress towards PT goals: Not progressing toward goals - comment (limited session due to pt wanting to eat breakfast)    Frequency    Min 2X/week      PT Plan Current plan remains appropriate;Frequency needs to be updated    Co-evaluation              AM-PAC PT "6 Clicks" Mobility   Outcome Measure  Help needed turning from your back to your side while in a flat bed without using bedrails?: A Little Help needed moving from lying on your back to sitting on the side of a flat bed without using bedrails?: A Little Help needed moving to and from a bed to a chair (including a wheelchair)?: A Little Help needed standing up from a chair using your arms (e.g., wheelchair or bedside chair)?: A Little Help needed to walk in hospital room?: A Little Help needed climbing 3-5 steps with a railing? : Total 6 Click Score: 16    End of Session Equipment Utilized During Treatment: Gait belt Activity Tolerance: Patient  tolerated treatment well Patient left: in chair;with call bell/phone within reach;with nursing/sitter in room Nurse Communication: Mobility status PT Visit Diagnosis: Other abnormalities of gait and mobility (R26.89);History of falling (Z91.81);Muscle weakness (generalized) (M62.81);Pain Pain - Right/Left: Left Pain - part of body: Leg     Time: 9767-3419 PT Time Calculation (min) (ACUTE ONLY): 15 min  Charges:  $Gait Training: 8-22 mins                      [place]  Office [phone]    Rexanne Mano 06/14/2022, 9:07 AM

## 2023-07-05 DIAGNOSIS — H409 Unspecified glaucoma: Secondary | ICD-10-CM | POA: Diagnosis not present

## 2023-07-05 DIAGNOSIS — E039 Hypothyroidism, unspecified: Secondary | ICD-10-CM | POA: Diagnosis not present

## 2023-07-05 DIAGNOSIS — R2689 Other abnormalities of gait and mobility: Secondary | ICD-10-CM | POA: Diagnosis not present

## 2023-07-05 DIAGNOSIS — D849 Immunodeficiency, unspecified: Secondary | ICD-10-CM | POA: Diagnosis not present

## 2023-07-05 DIAGNOSIS — I959 Hypotension, unspecified: Secondary | ICD-10-CM | POA: Diagnosis not present
